# Patient Record
Sex: Male | Born: 1965
Health system: Southern US, Community
[De-identification: ages and names within clinical notes are randomized; demographics above are authoritative.]

## PROBLEM LIST (undated history)

## (undated) DIAGNOSIS — C189 Malignant neoplasm of colon, unspecified: Secondary | ICD-10-CM

## (undated) DIAGNOSIS — I10 Essential (primary) hypertension: Secondary | ICD-10-CM

## (undated) DIAGNOSIS — C801 Malignant (primary) neoplasm, unspecified: Secondary | ICD-10-CM

## (undated) DIAGNOSIS — F32A Depression, unspecified: Secondary | ICD-10-CM

## (undated) DIAGNOSIS — M109 Gout, unspecified: Secondary | ICD-10-CM

## (undated) DIAGNOSIS — F419 Anxiety disorder, unspecified: Secondary | ICD-10-CM

## (undated) DIAGNOSIS — T7840XA Allergy, unspecified, initial encounter: Secondary | ICD-10-CM

## (undated) DIAGNOSIS — G4730 Sleep apnea, unspecified: Secondary | ICD-10-CM

## (undated) DIAGNOSIS — E785 Hyperlipidemia, unspecified: Secondary | ICD-10-CM

## (undated) DIAGNOSIS — K219 Gastro-esophageal reflux disease without esophagitis: Secondary | ICD-10-CM

## (undated) HISTORY — DX: Allergy, unspecified, initial encounter: T78.40XA

## (undated) HISTORY — DX: Gastro-esophageal reflux disease without esophagitis: K21.9

## (undated) HISTORY — DX: Hyperlipidemia, unspecified: E78.5

## (undated) HISTORY — DX: Anxiety disorder, unspecified: F41.9

## (undated) HISTORY — DX: Gout, unspecified: M10.9

## (undated) HISTORY — DX: Depression, unspecified: F32.A

## (undated) HISTORY — PX: ANKLE SURGERY: SHX546

---

## 2003-11-19 ENCOUNTER — Emergency Department (HOSPITAL_COMMUNITY): Admission: EM | Admit: 2003-11-19 | Discharge: 2003-11-19 | Payer: Self-pay

## 2006-04-21 ENCOUNTER — Encounter: Admission: RE | Admit: 2006-04-21 | Discharge: 2006-04-21 | Payer: Self-pay | Admitting: Orthopedic Surgery

## 2006-08-26 ENCOUNTER — Ambulatory Visit (HOSPITAL_BASED_OUTPATIENT_CLINIC_OR_DEPARTMENT_OTHER): Admission: RE | Admit: 2006-08-26 | Discharge: 2006-08-26 | Payer: Self-pay | Admitting: Orthopedic Surgery

## 2010-08-07 NOTE — Op Note (Signed)
Derek [name], SANTUCCI NO.:  [id number]   MEDICAL RECORD NO.:  [id number]          PATIENT TYPE:  AMB   LOCATION:  DSC                          FACILITY:  [facility]   PHYSICIAN:  Leonides Grills, M.D.     DATE OF BIRTH:  31-Mar-1965   DATE OF PROCEDURE:  08/26/2006  DATE OF DISCHARGE:                               OPERATIVE REPORT   PREOPERATIVE DIAGNOSIS:  Complication with hardware, right ankle.   POSTOPERATIVE DIAGNOSIS:  Complication with hardware, right ankle.   OPERATION:  1. Hardware removal, deep right ankle.  2. Stress x-rays, ankle.   ANESTHESIA:  General.   SURGEON:  Leonides Grills, MD  ASSISTANT: Evlyn Kanner, PA-C   ESTIMATED BLOOD LOSS:  Minimal.   TOURNIQUET:  None.   COMPLICATIONS:  None.   DISPOSITION:  Stable to PR.   INDICATIONS:  This is a 45 year old male who sustained a complex ankle  fracture with a distal tib-fib syndesmotic rupture that required  fixation.  He now presented for hardware removal of his syndesmotic  fixation.  He was consented to the above procedure.  All risks including  infection, neurovascular injury, and the possibility of syndesmotic  ligament insufficiency and possible re-fixation were all explained.  Questions were encouraged and answered.   OPERATIVE REPORT:  The patient was brought to the Operating Room and  placed in the supine position.  After adequate general endotracheal tube  anesthesia was administered as well as Ancef 1 gram IV piggyback, the  right lower extremity was prepped and draped in a sterile manner.  No  tourniquet was used.  A longitudinal incision under C-arm guidance was  then made directly over the syndesmotic screw head over the previous  incision.  Dissection was carried down directly to bone.  Soft tissue  was elevated around the screw head, and with a large frag screwdriver  the screw was removed.  This was done without difficulty.  Rotational  stress x-rays were obtained of the right  ankle that showed no lateral  subluxation of the talus within the ankle mortise or diastasis of the  distal tib-fib syndesmosis.  The wound was copiously irrigated with  normal saline.  The skin was closed with a 4-0 nylon stitch.  A sterile  dressing was applied.  A hard-soled shoe was applied.  The patient was  stable to the PR.   POSTOP COURSE:  The patient will follow up on Monday of next week.  At  that time we will remove the stitches and will obtain a weightbearing AP  of the bilateral ankles.  He will then proceed with activities as  tolerated in a normal shoe.      Leonides Grills, M.D.  Electronically Signed     PB/MEDQ  D:  08/26/2006  T:  08/26/2006  Job:  [id number]

## 2011-01-10 LAB — BASIC METABOLIC PANEL
CO2: 25
GFR calc Af Amer: >60
Glucose, Bld: 89
Potassium: 4
Sodium: 137

## 2011-01-10 LAB — POCT HEMOGLOBIN-HEMACUE: Hemoglobin: 17.2 — ABNORMAL HIGH

## 2011-02-09 ENCOUNTER — Emergency Department (HOSPITAL_COMMUNITY): Payer: BC Managed Care – PPO

## 2011-02-09 ENCOUNTER — Encounter: Payer: Self-pay | Admitting: *Deleted

## 2011-02-09 ENCOUNTER — Emergency Department (HOSPITAL_COMMUNITY)
Admission: EM | Admit: 2011-02-09 | Discharge: 2011-02-09 | Disposition: A | Discharge status: Home / Self Care | Payer: BC Managed Care – PPO | Attending: Emergency Medicine | Admitting: Emergency Medicine

## 2011-02-09 DIAGNOSIS — M21372 Foot drop, left foot: Secondary | ICD-10-CM

## 2011-02-09 DIAGNOSIS — M79609 Pain in unspecified limb: Secondary | ICD-10-CM | POA: Insufficient documentation

## 2011-02-09 DIAGNOSIS — M216X9 Other acquired deformities of unspecified foot: Secondary | ICD-10-CM | POA: Insufficient documentation

## 2011-02-09 HISTORY — DX: Essential (primary) hypertension: I10

## 2011-02-09 NOTE — ED Provider Notes (Signed)
History     CSN: [id number] Arrival date & time: 02/09/2011  5:25 PM   First MD Initiated Contact with Patient 02/09/11 1814      Chief Complaint  Patient presents with  . Foot Pain    (Consider location/radiation/quality/duration/timing/severity/associated sxs/prior treatment) The history is provided by the patient.   the patient awoke this morning with inability to dorsiflex his left foot (foot drop).  He reports he was normal yesterday and awoke this morning like that.  His had no new back pain.  He is otherwise had no weakness of his left leg.  His had no numbness or tingling of his left leg.  He denies recent trauma.  Patient does have a history of remote back pain however no current active back pain.  He does report having several drinks last night which he reports is more than typical for him.  Nothing worsens the symptoms.  Nothing improves his symptoms.  Currently he is with 0/10 pain  Past Medical History  Diagnosis Date  . Hypertension     History reviewed. No pertinent past surgical history.  History reviewed. No pertinent family history.  History  Substance Use Topics  . Smoking status: Never Smoker   . Smokeless tobacco: Not on file  . Alcohol Use: Yes      Review of Systems  All other systems reviewed and are negative.    Allergies  Review of patient's allergies indicates no known allergies.  Home Medications   Current Outpatient Rx  Name Route Sig Dispense Refill  . ALPRAZOLAM 0.25 MG PO TABS Oral Take 0.25 mg by mouth 3 (three) times daily as needed. Anxiety      . ASPIRIN 81 MG PO TABS Oral Take 81 mg by mouth daily.      Marland Kitchen CITALOPRAM HYDROBROMIDE 40 MG PO TABS Oral Take 40 mg by mouth daily.      . OMEGA-3 FATTY ACIDS 1000 MG PO CAPS Oral Take 1 g by mouth daily.      Marland Kitchen HYDROCHLOROTHIAZIDE 25 MG PO TABS Oral Take 25 mg by mouth daily.      Marland Kitchen LISINOPRIL 20 MG PO TABS Oral Take 20 mg by mouth daily.      Carma Leaven M PLUS PO TABS Oral Take 1 tablet  by mouth daily.      . NEBIVOLOL HCL 10 MG PO TABS Oral Take 10 mg by mouth daily.      Marland Kitchen OMEPRAZOLE 20 MG PO CPDR Oral Take 20 mg by mouth daily.      Marland Kitchen POTASSIUM CHLORIDE 10 MEQ PO TBCR Oral Take 10 mEq by mouth daily.      Marland Kitchen SIMVASTATIN 40 MG PO TABS Oral Take 40 mg by mouth at bedtime.        BP 148/86  Pulse 88  Temp(Src) 98 F (36.7 C) (Oral)  Resp 20  SpO2 98%  Physical Exam  Constitutional: He is oriented to person, place, and time. He appears well-developed and well-nourished.  HENT:  Head: Normocephalic.  Eyes: EOM are normal.  Neck: Normal range of motion.  Pulmonary/Chest: Effort normal.  Musculoskeletal: Normal range of motion.       No thoracic or L-spine tenderness or paraspinal tenderness.  Right lower extremity with normal strength in his major muscle groups.  Left lower extremity was 5 out of 5 strength in all major muscle groups except he has the inability to dorsiflex his left foot.  He has 2+ patellar reflexes bilaterally.  Neurological:  He is alert and oriented to person, place, and time.  Psychiatric: He has a normal mood and affect.    ED Course  Procedures (including critical care time)  Labs Reviewed - No data to display Mr Lumbar Spine Wo Contrast  02/09/2011  *RADIOLOGY REPORT*  Clinical Data: Low back pain.  New foot drop  MRI LUMBAR SPINE WITHOUT CONTRAST  Technique:  Multiplanar and multiecho pulse sequences of the lumbar spine were obtained without intravenous contrast.  Comparison: None.  Findings: Normal alignment.  No fracture or mass lesion.  Conus medullaris is normal and terminates at L1.  L1-2:  Mild disc degeneration  L2-3:  Negative  L3-4:  Negative  L4-5:  Mild disc degeneration.  Small left foraminal disc protrusion without neural impingement or significant spinal stenosis.  Early facet degeneration.  L5-S1:  Mild disc bulging and mild facet degeneration without neural compression or stenosis.  IMPRESSION: Mild disc degeneration at L4-5.   Small left foraminal disc protrusion which is not causing any neural compression.  Mild disc and facet degeneration L5-S1 without stenosis.  Original Report Authenticated By: Camelia Phenes, M.D.   I personally reviewed the patient's MRI scan  1. Left foot drop       MDM  Left foot drop with likely peroneal nerve involvement.  The patient's history of remote back pain and MRI results found was obtained demonstrating no evidence of acute pathology.  Her neurosurgeon Dr. Jordan Likes is where the patient was coming and briefly evaluated the patient in the emergency department and agreed with MRI however he also agreed that this is likely peroneal nerve involvement.  Dr Jordan Likes requested that if everything is normal with the MRI scan To see him back in his office in one to 2 weeks for repeat evaluation        Lyanne Co, MD 02/09/11 [phone]

## 2011-02-09 NOTE — ED Notes (Signed)
Patient here with c/o left foot "drop foot".  He noticed it this morning. States that family has history of same.  No problems ambulating.  Patient c/o "slight numbess" to that left foot.

## 2011-03-20 ENCOUNTER — Other Ambulatory Visit: Payer: Self-pay | Admitting: *Deleted

## 2011-03-21 ENCOUNTER — Other Ambulatory Visit: Payer: Self-pay | Admitting: *Deleted

## 2011-03-22 ENCOUNTER — Other Ambulatory Visit: Payer: Self-pay | Admitting: *Deleted

## 2011-03-22 MED ORDER — LISINOPRIL 20 MG PO TABS: 20.0000 mg | ORAL_TABLET | Freq: Every day | ORAL | Status: DC

## 2011-03-22 MED ORDER — NEBIVOLOL HCL 10 MG PO TABS: 10.0000 mg | ORAL_TABLET | Freq: Every day | ORAL | Status: DC

## 2011-03-22 MED ORDER — POTASSIUM CHLORIDE 10 MEQ PO TBCR: 10.0000 meq | EXTENDED_RELEASE_TABLET | Freq: Every day | ORAL | Status: DC

## 2011-03-22 MED ORDER — SIMVASTATIN 40 MG PO TABS: 40.0000 mg | ORAL_TABLET | Freq: Every day | ORAL | Status: DC

## 2011-03-22 MED ORDER — HYDROCHLOROTHIAZIDE 25 MG PO TABS: 25.0000 mg | ORAL_TABLET | Freq: Every day | ORAL | Status: DC

## 2011-03-25 ENCOUNTER — Other Ambulatory Visit: Payer: Self-pay | Admitting: *Deleted

## 2011-03-25 MED ORDER — NEBIVOLOL HCL 10 MG PO TABS: 10.0000 mg | ORAL_TABLET | Freq: Every day | ORAL | Status: DC

## 2011-03-25 MED ORDER — LISINOPRIL 20 MG PO TABS: 20.0000 mg | ORAL_TABLET | Freq: Every day | ORAL | Status: DC

## 2011-03-25 MED ORDER — HYDROCHLOROTHIAZIDE 25 MG PO TABS: 25.0000 mg | ORAL_TABLET | Freq: Every day | ORAL | Status: DC

## 2011-03-25 MED ORDER — SIMVASTATIN 40 MG PO TABS: 40.0000 mg | ORAL_TABLET | Freq: Every day | ORAL | Status: DC

## 2011-03-25 NOTE — Telephone Encounter (Signed)
Fax Received. Refill Completed. Stacy Deshler Chowoe (R.M.A)   

## 2011-03-27 ENCOUNTER — Other Ambulatory Visit: Payer: Self-pay | Admitting: *Deleted

## 2011-03-27 MED ORDER — POTASSIUM CHLORIDE ER 10 MEQ PO TBCR: 10.0000 meq | EXTENDED_RELEASE_TABLET | Freq: Every day | ORAL | Status: DC

## 2011-03-27 NOTE — Telephone Encounter (Signed)
Fax Received. Refill Completed. Atif Chapple Chowoe (R.M.A)   

## 2011-04-25 ENCOUNTER — Ambulatory Visit: Payer: BC Managed Care – PPO | Admitting: Cardiovascular Disease

## 2011-07-18 ENCOUNTER — Encounter: Payer: Self-pay | Admitting: *Deleted

## 2011-09-24 ENCOUNTER — Other Ambulatory Visit: Payer: Self-pay | Admitting: *Deleted

## 2011-09-24 MED ORDER — POTASSIUM CHLORIDE ER 10 MEQ PO TBCR: 10.0000 meq | EXTENDED_RELEASE_TABLET | Freq: Every day | ORAL | Status: DC

## 2011-09-24 MED ORDER — SIMVASTATIN 40 MG PO TABS: 40.0000 mg | ORAL_TABLET | Freq: Every day | ORAL | Status: DC

## 2011-09-24 MED ORDER — LISINOPRIL-HYDROCHLOROTHIAZIDE 20-25 MG PO TABS: 1.0000 | ORAL_TABLET | Freq: Every day | ORAL | Status: DC

## 2011-09-24 MED ORDER — NEBIVOLOL HCL 10 MG PO TABS: 10.0000 mg | ORAL_TABLET | Freq: Every day | ORAL | Status: DC

## 2011-09-24 NOTE — Telephone Encounter (Signed)
Left a message on patients cell stating office is unable to make any more until patient comes in for an office visit. Pt has not seen Doctor Nahser since 2011.Pt needs appointment then refill can be made Fax Received. Refill Completed. Derek [name] (R.M.A). I am going to give patient 90 days supply due to him using a mail order pharmacy.

## 2011-09-24 NOTE — Telephone Encounter (Signed)
Pt needs appointment then refill can be made Fax Received. Refill Completed. Gretna Bergin Chowoe (R.M.A)   

## 2011-10-21 ENCOUNTER — Telehealth: Payer: Self-pay | Admitting: Cardiovascular Disease

## 2011-10-21 NOTE — Telephone Encounter (Signed)
New Problem:    Patient called in to confirm that we nebivolol (BYSTOLIC) 10 MG tablet, hydrochlorothiazide (HYDRODIURIL) 25 MG tablet, lisinopril-hydrochlorothiazide (PRINZIDE,ZESTORETIC) 20-25 MG per tablet and Klor-Con.  Please call back once the orders have been placed.

## 2011-10-22 ENCOUNTER — Other Ambulatory Visit: Payer: Self-pay | Admitting: *Deleted

## 2011-10-22 MED ORDER — POTASSIUM CHLORIDE ER 10 MEQ PO TBCR: 10.0000 meq | EXTENDED_RELEASE_TABLET | Freq: Every day | ORAL | Status: DC

## 2011-10-22 MED ORDER — HYDROCHLOROTHIAZIDE 25 MG PO TABS: 25.0000 mg | ORAL_TABLET | Freq: Every day | ORAL | Status: DC

## 2011-10-22 MED ORDER — SIMVASTATIN 40 MG PO TABS: 40.0000 mg | ORAL_TABLET | Freq: Every day | ORAL | Status: DC

## 2011-10-22 MED ORDER — NEBIVOLOL HCL 10 MG PO TABS: 10.0000 mg | ORAL_TABLET | Freq: Every day | ORAL | Status: DC

## 2011-10-22 MED ORDER — LISINOPRIL-HYDROCHLOROTHIAZIDE 20-25 MG PO TABS: 1.0000 | ORAL_TABLET | Freq: Every day | ORAL | Status: DC

## 2011-10-22 NOTE — Telephone Encounter (Signed)
Pt needs appointment then refill can be made Fax Received. Refill Completed. Derek [name] (R.M.A)   

## 2011-10-22 NOTE — Telephone Encounter (Signed)
rx sent to [facility] and one sent to local pharmacy. Pt notified to keep appt with nahser on 8/19.

## 2011-11-11 ENCOUNTER — Ambulatory Visit: Payer: BC Managed Care – PPO | Admitting: Cardiovascular Disease

## 2011-12-03 ENCOUNTER — Encounter: Payer: Self-pay | Admitting: Cardiovascular Disease

## 2011-12-05 ENCOUNTER — Encounter: Payer: Self-pay | Admitting: Cardiovascular Disease

## 2011-12-13 ENCOUNTER — Ambulatory Visit (INDEPENDENT_AMBULATORY_CARE_PROVIDER_SITE_OTHER): Payer: 59 | Admitting: Cardiovascular Disease

## 2011-12-13 ENCOUNTER — Encounter: Payer: Self-pay | Admitting: Cardiovascular Disease

## 2011-12-13 VITALS — BP 146/91 | HR 72 | Ht 75.0 in | Wt 255.4 lb

## 2011-12-13 DIAGNOSIS — E785 Hyperlipidemia, unspecified: Secondary | ICD-10-CM

## 2011-12-13 DIAGNOSIS — I10 Essential (primary) hypertension: Secondary | ICD-10-CM

## 2011-12-13 MED ORDER — NEBIVOLOL HCL 20 MG PO TABS: 20.0000 mg | ORAL_TABLET | Freq: Every day | ORAL | Status: DC

## 2011-12-13 NOTE — Progress Notes (Signed)
Trellis Moment Date of Birth  January 03, 1966       [facility]    [place] [address]  [address], [address] [place], [place]  [address]   [place], [place]  [address] [phone]     [phone]   Fax  [phone]    Fax [phone]  Problem List: 1. HTN 2. Hyperlipidemia  History of Present Illness:  Dryden is a 46 yo with hx of HTN and hyperlipidemia.  He   Current Outpatient Prescriptions on File Prior to Visit  Medication Sig Dispense Refill  . ALPRAZolam (XANAX) 0.25 MG tablet Take 0.25 mg by mouth 3 (three) times daily as needed. Anxiety        . aspirin 81 MG tablet Take 81 mg by mouth daily.        . citalopram (CELEXA) 40 MG tablet Take 40 mg by mouth daily.        . fish oil-omega-3 fatty acids 1000 MG capsule Take 1 g by mouth daily.        . hydrochlorothiazide (HYDRODIURIL) 25 MG tablet Take 1 tablet (25 mg total) by mouth daily.  90 tablet  0  . lisinopril-hydrochlorothiazide (PRINZIDE,ZESTORETIC) 20-25 MG per tablet Take 1 tablet by mouth daily.  90 tablet  0  . Multiple Vitamins-Minerals (MULTIVITAMINS THER. W/MINERALS) TABS Take 1 tablet by mouth daily.        . nebivolol (BYSTOLIC) 10 MG tablet Take 1 tablet (10 mg total) by mouth daily.  90 tablet  0  . omeprazole (PRILOSEC) 20 MG capsule Take 20 mg by mouth daily.        . potassium chloride (K-DUR) 10 MEQ tablet Take 1 tablet (10 mEq total) by mouth daily.  90 tablet  0  . simvastatin (ZOCOR) 40 MG tablet Take 1 tablet (40 mg total) by mouth at bedtime.  90 tablet  0    No Known Allergies  Past Medical History  Diagnosis Date  . Hypertension   . HLD (hyperlipidemia)   . GERD (gastroesophageal reflux disease)   . Anxiety     Past Surgical History  Procedure Date  . Ankle surgery     right    History  Smoking status  . Never Smoker   Smokeless tobacco  . Not on file    History  Alcohol Use  . Yes    Family History  Problem Relation Age of Onset  .  Coronary artery disease    . Hypertension      Reviw of Systems:  Reviewed in the HPI.  All other systems are negative.  Physical Exam: Blood pressure 146/91, pulse 72, height 6\' 3"  (1.905 m), weight 255 lb 6.4 oz (115.849 kg). General: Well developed, well nourished, in no acute distress.  Head: Normocephalic, atraumatic, sclera non-icteric, mucus membranes are moist,   Neck: Supple. Carotids are 2 + without bruits. No JVD  Lungs: Clear bilaterally to auscultation.  Heart: regular rate.  normal  S1 S2. No murmurs, gallops or rubs.  Abdomen: Soft, non-tender, non-distended with normal bowel sounds. No hepatomegaly. No rebound/guarding. No masses.  Msk:  Strength and tone are normal  Extremities: No clubbing or cyanosis. No edema.  Distal pedal pulses are 2+ and equal bilaterally.  Neuro: Alert and oriented X 3. Moves all extremities spontaneously.  Psych:  Responds to questions appropriately with a normal affect.  ECG: 12/13/2011-normal sinus rhythm at 70 beats a minute. The EKG is normal.  Assessment /  Plan:

## 2011-12-13 NOTE — Patient Instructions (Addendum)
Your physician wants you to follow-up in: 1 YEAR  You will receive a reminder letter in the mail two months in advance. If you don't receive a letter, please call our office to schedule the follow-up appointment.  Your physician has recommended you make the following change in your medication:  INCREASE BYSTOLIC TO 20 MG DAILY- F/U WITH YOUR FAMILY PHYSICIAN  REDUCE HIGH SODIUM FOODS LIKE CANNED SOUP, GRAVY, SAUCES, READY PREPARED FOODS LIKE FROZEN FOODS; LEAN CUISINE, LASAGNA. BACON, SAUSAGE, LUNCH MEAT, FAST FOODS.Marland Kitchen    DASH Diet The DASH diet stands for "Dietary Approaches to Stop Hypertension." It is a healthy eating plan that has been shown to reduce high blood pressure (hypertension) in as little as 14 days, while also possibly providing other significant health benefits. These other health benefits include reducing the risk of breast cancer after menopause and reducing the risk of type 2 diabetes, heart disease, colon cancer, and stroke. Health benefits also include weight loss and slowing kidney failure in patients with chronic kidney disease.  DIET GUIDELINES  Limit salt (sodium). Your diet should contain less than 1500 mg of sodium daily.   Limit refined or processed carbohydrates. Your diet should include mostly whole grains. Desserts and added sugars should be used sparingly.   Include small amounts of heart-healthy fats. These types of fats include nuts, oils, and tub margarine. Limit saturated and trans fats. These fats have been shown to be harmful in the body.  CHOOSING FOODS  The following food groups are based on a 2000 calorie diet. See your Registered Dietitian for individual calorie needs. Grains and Grain Products (6 to 8 servings daily)  Eat More Often: Whole-wheat bread, brown rice, whole-grain or wheat pasta, quinoa, popcorn without added fat or salt (air popped).   Eat Less Often: White bread, white pasta, white rice, cornbread.  Vegetables (4 to 5 servings  daily)  Eat More Often: Fresh, frozen, and canned vegetables. Vegetables may be raw, steamed, roasted, or grilled with a minimal amount of fat.   Eat Less Often/Avoid: Creamed or fried vegetables. Vegetables in a cheese sauce.  Fruit (4 to 5 servings daily)  Eat More Often: All fresh, canned (in natural juice), or frozen fruits. Dried fruits without added sugar. One hundred percent fruit juice ( cup [237 mL] daily).   Eat Less Often: Dried fruits with added sugar. Canned fruit in light or heavy syrup.  [employer] [place], Fish, and Poultry (2 servings or less daily. One serving is 3 to 4 oz [85-114 g]).  Eat More Often: Ninety percent or leaner ground beef, tenderloin, sirloin. Round cuts of beef, chicken breast, [place] breast. All fish. Grill, bake, or broil your meat. Nothing should be fried.   Eat Less Often/Avoid: Fatty cuts of meat, [place], or chicken leg, thigh, or wing. Fried cuts of meat or fish.  Dairy (2 to 3 servings)  Eat More Often: Low-fat or fat-free milk, low-fat plain or light yogurt, reduced-fat or part-skim cheese.   Eat Less Often/Avoid: Milk (whole, 2%, skim, or chocolate).Whole milk yogurt. Full-fat cheeses.  Nuts, Seeds, and Legumes (4 to 5 servings per week)  Eat More Often: All without added salt.   Eat Less Often/Avoid: Salted nuts and seeds, canned beans with added salt.  Fats and Sweets (limited)  Eat More Often: Vegetable oils, tub margarines without trans fats, sugar-free gelatin. Mayonnaise and salad dressings.   Eat Less Often/Avoid: Coconut oils, palm oils, butter, stick margarine, cream, half and half, cookies, candy, pie.  FOR  MORE INFORMATION The Dash Diet Eating Plan: www.dashdiet.org Document Released: 02/28/2011 Document Reviewed: 02/18/2011 [facility] Patient Information 2012 [place], [place].

## 2011-12-13 NOTE — Assessment & Plan Note (Signed)
Pounds blood pressure remains mildly elevated. He is not able to exercise as much as possible because of his new job. He still eats occasional salty food.  We will increase his Bystolic to 20 mg a day. I'll see him in one year. He is to call me if he has any additional problems.

## 2011-12-19 ENCOUNTER — Encounter: Payer: Self-pay | Admitting: Cardiovascular Disease

## 2012-02-05 ENCOUNTER — Other Ambulatory Visit: Payer: Self-pay | Admitting: *Deleted

## 2012-02-05 MED ORDER — POTASSIUM CHLORIDE ER 10 MEQ PO TBCR: 10.0000 meq | EXTENDED_RELEASE_TABLET | Freq: Every day | ORAL | Status: DC

## 2012-02-05 MED ORDER — LISINOPRIL-HYDROCHLOROTHIAZIDE 20-25 MG PO TABS: 1.0000 | ORAL_TABLET | Freq: Every day | ORAL | Status: DC

## 2012-02-05 MED ORDER — NEBIVOLOL HCL 20 MG PO TABS: 20.0000 mg | ORAL_TABLET | Freq: Every day | ORAL | Status: DC

## 2012-02-05 MED ORDER — HYDROCHLOROTHIAZIDE 25 MG PO TABS: 25.0000 mg | ORAL_TABLET | Freq: Every day | ORAL | Status: DC

## 2012-02-05 MED ORDER — SIMVASTATIN 40 MG PO TABS: 40.0000 mg | ORAL_TABLET | Freq: Every day | ORAL | Status: DC

## 2012-02-05 NOTE — Telephone Encounter (Signed)
Fax Received. Refill Completed. Travon Crochet Chowoe (R.M.A)   

## 2012-02-05 NOTE — Telephone Encounter (Signed)
Fax Received. Refill Completed. Tyreke Kaeser Chowoe (R.M.A)   

## 2012-02-06 ENCOUNTER — Other Ambulatory Visit: Payer: Self-pay | Admitting: *Deleted

## 2012-02-06 NOTE — Telephone Encounter (Signed)
Opened in Error.

## 2012-02-10 ENCOUNTER — Other Ambulatory Visit: Payer: Self-pay | Admitting: *Deleted

## 2012-02-10 NOTE — Telephone Encounter (Signed)
Opened in Error.

## 2012-02-24 ENCOUNTER — Telehealth: Payer: Self-pay | Admitting: Cardiovascular Disease

## 2012-02-24 MED ORDER — POTASSIUM CHLORIDE ER 10 MEQ PO TBCR: 10.0000 meq | EXTENDED_RELEASE_TABLET | Freq: Every day | ORAL | Status: DC

## 2012-02-24 NOTE — Telephone Encounter (Signed)
Pt needs potassium called into [facility] for 30 day supply will run out before mail order gets to him

## 2012-02-24 NOTE — Telephone Encounter (Signed)
Fax Received. Refill Completed. Vu Liebman Chowoe (R.M.A)   

## 2012-10-30 ENCOUNTER — Telehealth: Payer: Self-pay | Admitting: *Deleted

## 2012-10-30 NOTE — Telephone Encounter (Signed)
Received PA request from [facility] [employer] for bystolic. Pt does not have an app and I asked him to call back to see if we need to obtain the PA or his pcp Dr Eloise Harman. I asked him to call back and update me/ I am holding the paperwork till further word.

## 2012-10-30 NOTE — Telephone Encounter (Signed)
PT WILL HAVE HIS PCP REFILL HIS BYSTOLIC, DECLINES APPOINTMENT AT THIS TIME.

## 2012-11-17 ENCOUNTER — Telehealth: Payer: Self-pay | Admitting: Cardiovascular Disease

## 2012-11-17 MED ORDER — CARVEDILOL 25 MG PO TABS: 25.0000 mg | ORAL_TABLET | Freq: Two times a day (BID) | ORAL | Status: DC

## 2012-11-17 NOTE — Telephone Encounter (Signed)
We can try carvedilol 25 bid to see if that keeps his BP controlled since his insurance company has refused bystolic,

## 2012-11-17 NOTE — Telephone Encounter (Signed)
New Prob  Pt has a question regarding one of his medication Bystolic,  She previous TE that Jodette did with pt.

## 2012-11-17 NOTE — Telephone Encounter (Signed)
Patient states that the insurance company denied the Bystolic prior authorization submitted by his PCP.  States he only has a couple of Bystolic doses left at this time. Most recent BP 140/85 range. States he needs advisement on whether he should come in for followup with Dr. Elease Hashimoto and appeal the Bystolic denial or is there another medication that he can take instead.

## 2012-11-17 NOTE — Telephone Encounter (Signed)
Spoke to patient. He agreed with treatment plan. Asked for Carvedilol to be called in to the [facility] so that he can try it for one month, then if he feels it is working for him, he will call us back to send Rx into mail order pharmacy, [facility]. Reviewed medication, dosage, side effects and usage information. Completed Rx to pharmacy.

## 2014-04-07 ENCOUNTER — Emergency Department (HOSPITAL_COMMUNITY)
Admission: EM | Admit: 2014-04-07 | Discharge: 2014-04-07 | Disposition: A | Discharge status: Home / Self Care | Payer: 59 | Attending: Emergency Medicine | Admitting: Emergency Medicine

## 2014-04-07 ENCOUNTER — Encounter (HOSPITAL_COMMUNITY): Payer: Self-pay | Admitting: Emergency Medicine

## 2014-04-07 ENCOUNTER — Emergency Department (HOSPITAL_COMMUNITY): Payer: 59

## 2014-04-07 DIAGNOSIS — Z7982 Long term (current) use of aspirin: Secondary | ICD-10-CM | POA: Insufficient documentation

## 2014-04-07 DIAGNOSIS — K219 Gastro-esophageal reflux disease without esophagitis: Secondary | ICD-10-CM | POA: Diagnosis not present

## 2014-04-07 DIAGNOSIS — Z79899 Other long term (current) drug therapy: Secondary | ICD-10-CM | POA: Insufficient documentation

## 2014-04-07 DIAGNOSIS — E785 Hyperlipidemia, unspecified: Secondary | ICD-10-CM | POA: Insufficient documentation

## 2014-04-07 DIAGNOSIS — R0789 Other chest pain: Secondary | ICD-10-CM | POA: Insufficient documentation

## 2014-04-07 DIAGNOSIS — F419 Anxiety disorder, unspecified: Secondary | ICD-10-CM | POA: Diagnosis not present

## 2014-04-07 DIAGNOSIS — I10 Essential (primary) hypertension: Secondary | ICD-10-CM | POA: Insufficient documentation

## 2014-04-07 DIAGNOSIS — R079 Chest pain, unspecified: Secondary | ICD-10-CM

## 2014-04-07 LAB — CBC WITH DIFFERENTIAL/PLATELET
Basophils Absolute: 0 10*3/uL (ref 0.0–0.1)
Basophils Relative: 0 % (ref 0–1)
EOS ABS: 0.2 10*3/uL (ref 0.0–0.7)
EOS PCT: 2 % (ref 0–5)
HCT: 41.2 % (ref 39.0–52.0)
Hemoglobin: 14.5 g/dL (ref 13.0–17.0)
Lymphocytes Relative: 38 % (ref 12–46)
Lymphs Abs: 3 10*3/uL (ref 0.7–4.0)
MCH: 30.7 pg (ref 26.0–34.0)
MCHC: 35.2 g/dL (ref 30.0–36.0)
MCV: 87.1 fL (ref 78.0–100.0)
MONO ABS: 0.6 10*3/uL (ref 0.1–1.0)
MONOS PCT: 8 % (ref 3–12)
NEUTROS ABS: 4 10*3/uL (ref 1.7–7.7)
Neutrophils Relative %: 52 % (ref 43–77)
PLATELETS: 185 10*3/uL (ref 150–400)
RBC: 4.73 MIL/uL (ref 4.22–5.81)
RDW: 12.5 % (ref 11.5–15.5)
WBC: 7.8 10*3/uL (ref 4.0–10.5)

## 2014-04-07 LAB — I-STAT TROPONIN, ED: TROPONIN I, POC: 0 ng/mL (ref 0.00–0.08)

## 2014-04-07 LAB — BASIC METABOLIC PANEL
Anion gap: 5 (ref 5–15)
BUN: 14 mg/dL (ref 6–23)
CO2: 31 mmol/L (ref 19–32)
Calcium: 9.9 mg/dL (ref 8.4–10.5)
Chloride: 101 mEq/L (ref 96–112)
Creatinine, Ser: 1.08 mg/dL (ref 0.50–1.35)
GFR, EST NON AFRICAN AMERICAN: 80 mL/min — AB (ref >90)
Glucose, Bld: 131 mg/dL — ABNORMAL HIGH (ref 70–99)
POTASSIUM: 3.7 mmol/L (ref 3.5–5.1)
Sodium: 137 mmol/L (ref 135–145)

## 2014-04-07 NOTE — ED Provider Notes (Signed)
CSN: [id number]     Arrival date & time 04/07/14  1148 History   First MD Initiated Contact with Patient 04/07/14 1440     Chief Complaint  Patient presents with  . Chest Pain     (Consider location/radiation/quality/duration/timing/severity/associated sxs/prior Treatment) HPI Comments: Patient with past medical history remarkable for hypertension and hyperlipidemia, and anxiety, presents to the emergency department with chief complaint of chest pain. States the pain started this morning and lasted for only a couple of seconds. He states that the pain was located in the center of his chest. He states that after he began thinking about the pain, he began to have a panic attack, became sweaty. He denies any shortness of breath, radiating symptoms, or exertional symptoms. He states the pain only lasted a few seconds, and then receded completely, but his anxiety persisted. Patient states that he took a 0.25 mg dose of Xanax with good relief. He states that he was still uncertain about the pain, so he came to the emergency department for further evaluation. He denies any cardiac history. Denies any history of DVT or PE.    The history is provided by the patient. No language interpreter was used.    Past Medical History  Diagnosis Date  . Hypertension   . HLD (hyperlipidemia)   . GERD (gastroesophageal reflux disease)   . Anxiety    Past Surgical History  Procedure Laterality Date  . Ankle surgery      right   Family History  Problem Relation Age of Onset  . Coronary artery disease    . Hypertension     History  Substance Use Topics  . Smoking status: Never Smoker   . Smokeless tobacco: Not on file  . Alcohol Use: Yes    Review of Systems  Constitutional: Negative for fever and chills.  Respiratory: Negative for shortness of breath.   Cardiovascular: Positive for chest pain.  Gastrointestinal: Negative for nausea, vomiting, diarrhea and constipation.  Genitourinary: Negative for  dysuria.  Psychiatric/Behavioral: The patient is nervous/anxious.   All other systems reviewed and are negative.     Allergies  Review of patient's allergies indicates no known allergies.  Home Medications   Prior to Admission medications   Medication Sig Start Date End Date Taking? Authorizing Provider  ALPRAZolam (XANAX) 0.25 MG tablet Take 0.25 mg by mouth 3 (three) times daily as needed. Anxiety      Historical Provider, MD  aspirin 81 MG tablet Take 81 mg by mouth daily.      Historical Provider, MD  carvedilol (COREG) 25 MG tablet Take 1 tablet (25 mg total) by mouth 2 (two) times daily with a meal. 11/17/12   Thayer Headings, MD  citalopram (CELEXA) 40 MG tablet Take 40 mg by mouth daily.      Historical Provider, MD  fish oil-omega-3 fatty acids 1000 MG capsule Take 1 g by mouth daily.      Historical Provider, MD  hydrochlorothiazide (HYDRODIURIL) 25 MG tablet Take 1 tablet (25 mg total) by mouth daily. 02/05/12   Thayer Headings, MD  lisinopril-hydrochlorothiazide (PRINZIDE,ZESTORETIC) 20-25 MG per tablet Take 1 tablet by mouth daily. 02/05/12   Thayer Headings, MD  Multiple Vitamins-Minerals (MULTIVITAMINS THER. W/MINERALS) TABS Take 1 tablet by mouth daily.      Historical Provider, MD  Nebivolol HCl 20 MG TABS Take 1 tablet (20 mg total) by mouth daily. 02/05/12   Thayer Headings, MD  omeprazole (PRILOSEC) 20 MG capsule Take  20 mg by mouth daily.      Historical Provider, MD  potassium chloride (K-DUR) 10 MEQ tablet Take 1 tablet (10 mEq total) by mouth daily. 02/24/12   Thayer Headings, MD  simvastatin (ZOCOR) 40 MG tablet Take 1 tablet (40 mg total) by mouth at bedtime. 02/05/12   Thayer Headings, MD   BP 136/80 mmHg  Pulse 78  Temp(Src) 98.1 F (36.7 C) (Oral)  Resp 11  SpO2 98% Physical Exam  Constitutional: He is oriented to person, place, and time. He appears well-developed and well-nourished.  HENT:  Head: Normocephalic and atraumatic.  Eyes: Conjunctivae and  EOM are normal. Pupils are equal, round, and reactive to light. Right eye exhibits no discharge. Left eye exhibits no discharge. No scleral icterus.  Neck: Normal range of motion. Neck supple. No JVD present.  Cardiovascular: Normal rate, regular rhythm and normal heart sounds.  Exam reveals no gallop and no friction rub.   No murmur heard. Pulmonary/Chest: Effort normal and breath sounds normal. No respiratory distress. He has no wheezes. He has no rales. He exhibits no tenderness.  Abdominal: Soft. He exhibits no distension and no mass. There is no tenderness. There is no rebound and no guarding.  Musculoskeletal: Normal range of motion. He exhibits no edema or tenderness.  Neurological: He is alert and oriented to person, place, and time.  Skin: Skin is warm and dry.  Psychiatric: He has a normal mood and affect. His behavior is normal. Judgment and thought content normal.  Nursing note and vitals reviewed.   ED Course  Procedures (including critical care time) Results for orders placed or performed during the hospital encounter of 20/94/70  Basic metabolic panel  Result Value Ref Range   Sodium 137 135 - 145 mmol/L   Potassium 3.7 3.5 - 5.1 mmol/L   Chloride 101 96 - 112 mEq/L   CO2 31 19 - 32 mmol/L   Glucose, Bld 131 (H) 70 - 99 mg/dL   BUN 14 6 - 23 mg/dL   Creatinine, Ser 1.08 0.50 - 1.35 mg/dL   Calcium 9.9 8.4 - 10.5 mg/dL   GFR calc non Af Amer 80 (L) >90 mL/min   GFR calc Af Amer >90 >90 mL/min   Anion gap 5 5 - 15  CBC with Differential  Result Value Ref Range   WBC 7.8 4.0 - 10.5 K/uL   RBC 4.73 4.22 - 5.81 MIL/uL   Hemoglobin 14.5 13.0 - 17.0 g/dL   HCT 41.2 39.0 - 52.0 %   MCV 87.1 78.0 - 100.0 fL   MCH 30.7 26.0 - 34.0 pg   MCHC 35.2 30.0 - 36.0 g/dL   RDW 12.5 11.5 - 15.5 %   Platelets 185 150 - 400 K/uL   Neutrophils Relative % 52 43 - 77 %   Neutro Abs 4.0 1.7 - 7.7 K/uL   Lymphocytes Relative 38 12 - 46 %   Lymphs Abs 3.0 0.7 - 4.0 K/uL   Monocytes  Relative 8 3 - 12 %   Monocytes Absolute 0.6 0.1 - 1.0 K/uL   Eosinophils Relative 2 0 - 5 %   Eosinophils Absolute 0.2 0.0 - 0.7 K/uL   Basophils Relative 0 0 - 1 %   Basophils Absolute 0.0 0.0 - 0.1 K/uL  I-stat troponin, ED (not at [facility])  Result Value Ref Range   Troponin i, poc 0.00 0.00 - 0.08 ng/mL   Comment 3  Dg Chest 2 View  04/07/2014   CLINICAL DATA:  Acute left and central chest pain. Initial encounter.  EXAM: CHEST  2 VIEW  COMPARISON:  None.  FINDINGS: The cardiomediastinal silhouette is unremarkable.  There is no evidence of focal airspace disease, pulmonary edema, suspicious pulmonary nodule/mass, pleural effusion, or pneumothorax. No acute bony abnormalities are identified.  IMPRESSION: No active cardiopulmonary disease.   Electronically Signed   By: Hassan Rowan M.D.   On: 04/07/2014 13:59     Imaging Review Dg Chest 2 View  04/07/2014   CLINICAL DATA:  Acute left and central chest pain. Initial encounter.  EXAM: CHEST  2 VIEW  COMPARISON:  None.  FINDINGS: The cardiomediastinal silhouette is unremarkable.  There is no evidence of focal airspace disease, pulmonary edema, suspicious pulmonary nodule/mass, pleural effusion, or pneumothorax. No acute bony abnormalities are identified.  IMPRESSION: No active cardiopulmonary disease.   Electronically Signed   By: Hassan Rowan M.D.   On: 04/07/2014 13:59     EKG Interpretation   Date/Time:  Thursday April 07 2014 11:55:58 EST Ventricular Rate:  88 PR Interval:  136 QRS Duration: 94 QT Interval:  394 QTC Calculation: 476 R Axis:   49 Text Interpretation:  Normal sinus rhythm Normal ECG Confirmed by  ZACKOWSKI  MD, SCOTT ([id number]) on 04/07/2014 3:48:45 PM      MDM   Final diagnoses:  Chest pain, unspecified chest pain type  Anxiety    Patient with a brief episode of chest pain. Symptoms have resolved completely. Patient uncertain whether symptoms were ACS versus anxiety. At this time, I'm inclined to believe that  the symptoms are related to anxiety. Patient has a heart score of 2. He is PERC negative.  He has a negative troponin, and a normal EKG. He is feeling well. Plan for discharge to home with primary care and cardiology follow-up. Patient understands and agrees with the plan. He is stable and ready for discharge.    Montine Circle, PA-C 04/07/14 1555  Fredia Sorrow, MD 04/07/14 [phone]

## 2014-04-07 NOTE — Discharge Instructions (Signed)

## 2014-04-07 NOTE — ED Notes (Signed)
Pt c/o generalized CP starting today; pt sts hx of anxiety and unsure if could be same

## 2016-04-18 DIAGNOSIS — E291 Testicular hypofunction: Secondary | ICD-10-CM | POA: Diagnosis not present

## 2016-05-28 DIAGNOSIS — E298 Other testicular dysfunction: Secondary | ICD-10-CM | POA: Diagnosis not present

## 2016-06-14 DIAGNOSIS — Z0000 Encounter for general adult medical examination without abnormal findings: Secondary | ICD-10-CM | POA: Diagnosis not present

## 2016-06-14 DIAGNOSIS — R7301 Impaired fasting glucose: Secondary | ICD-10-CM | POA: Diagnosis not present

## 2016-06-14 DIAGNOSIS — E784 Other hyperlipidemia: Secondary | ICD-10-CM | POA: Diagnosis not present

## 2016-06-14 DIAGNOSIS — Z125 Encounter for screening for malignant neoplasm of prostate: Secondary | ICD-10-CM | POA: Diagnosis not present

## 2016-06-18 DIAGNOSIS — Z0000 Encounter for general adult medical examination without abnormal findings: Secondary | ICD-10-CM | POA: Diagnosis not present

## 2016-06-18 DIAGNOSIS — R740 Nonspecific elevation of levels of transaminase and lactic acid dehydrogenase [LDH]: Secondary | ICD-10-CM | POA: Diagnosis not present

## 2016-06-18 DIAGNOSIS — Z1389 Encounter for screening for other disorder: Secondary | ICD-10-CM | POA: Diagnosis not present

## 2016-06-18 DIAGNOSIS — R7301 Impaired fasting glucose: Secondary | ICD-10-CM | POA: Diagnosis not present

## 2016-07-04 DIAGNOSIS — E298 Other testicular dysfunction: Secondary | ICD-10-CM | POA: Diagnosis not present

## 2016-07-25 DIAGNOSIS — E298 Other testicular dysfunction: Secondary | ICD-10-CM | POA: Diagnosis not present

## 2016-08-01 DIAGNOSIS — Z85828 Personal history of other malignant neoplasm of skin: Secondary | ICD-10-CM | POA: Diagnosis not present

## 2016-08-01 DIAGNOSIS — L821 Other seborrheic keratosis: Secondary | ICD-10-CM | POA: Diagnosis not present

## 2016-08-01 DIAGNOSIS — L820 Inflamed seborrheic keratosis: Secondary | ICD-10-CM | POA: Diagnosis not present

## 2016-08-01 DIAGNOSIS — L570 Actinic keratosis: Secondary | ICD-10-CM | POA: Diagnosis not present

## 2016-08-14 DIAGNOSIS — E298 Other testicular dysfunction: Secondary | ICD-10-CM | POA: Diagnosis not present

## 2016-09-19 DIAGNOSIS — E291 Testicular hypofunction: Secondary | ICD-10-CM | POA: Diagnosis not present

## 2016-10-08 DIAGNOSIS — E298 Other testicular dysfunction: Secondary | ICD-10-CM | POA: Diagnosis not present

## 2016-10-31 DIAGNOSIS — E298 Other testicular dysfunction: Secondary | ICD-10-CM | POA: Diagnosis not present

## 2016-12-03 DIAGNOSIS — E298 Other testicular dysfunction: Secondary | ICD-10-CM | POA: Diagnosis not present

## 2017-01-02 DIAGNOSIS — E298 Other testicular dysfunction: Secondary | ICD-10-CM | POA: Diagnosis not present

## 2017-01-28 DIAGNOSIS — E291 Testicular hypofunction: Secondary | ICD-10-CM | POA: Diagnosis not present

## 2017-02-18 DIAGNOSIS — E298 Other testicular dysfunction: Secondary | ICD-10-CM | POA: Diagnosis not present

## 2017-03-12 DIAGNOSIS — Z23 Encounter for immunization: Secondary | ICD-10-CM | POA: Diagnosis not present

## 2017-03-12 DIAGNOSIS — E291 Testicular hypofunction: Secondary | ICD-10-CM | POA: Diagnosis not present

## 2017-03-28 DIAGNOSIS — L821 Other seborrheic keratosis: Secondary | ICD-10-CM | POA: Diagnosis not present

## 2017-03-28 DIAGNOSIS — L570 Actinic keratosis: Secondary | ICD-10-CM | POA: Diagnosis not present

## 2017-03-28 DIAGNOSIS — Z85828 Personal history of other malignant neoplasm of skin: Secondary | ICD-10-CM | POA: Diagnosis not present

## 2017-04-03 DIAGNOSIS — E298 Other testicular dysfunction: Secondary | ICD-10-CM | POA: Diagnosis not present

## 2017-04-29 DIAGNOSIS — E298 Other testicular dysfunction: Secondary | ICD-10-CM | POA: Diagnosis not present

## 2017-05-20 DIAGNOSIS — E298 Other testicular dysfunction: Secondary | ICD-10-CM | POA: Diagnosis not present

## 2017-05-23 DIAGNOSIS — C189 Malignant neoplasm of colon, unspecified: Secondary | ICD-10-CM

## 2017-05-23 HISTORY — DX: Malignant neoplasm of colon, unspecified: C18.9

## 2017-05-23 HISTORY — PX: COLONOSCOPY: SHX174

## 2017-06-09 DIAGNOSIS — Z1211 Encounter for screening for malignant neoplasm of colon: Secondary | ICD-10-CM | POA: Diagnosis not present

## 2017-06-09 DIAGNOSIS — C187 Malignant neoplasm of sigmoid colon: Secondary | ICD-10-CM | POA: Diagnosis not present

## 2017-06-09 DIAGNOSIS — Z8371 Family history of colonic polyps: Secondary | ICD-10-CM | POA: Diagnosis not present

## 2017-06-09 DIAGNOSIS — D126 Benign neoplasm of colon, unspecified: Secondary | ICD-10-CM | POA: Diagnosis not present

## 2017-06-17 DIAGNOSIS — C187 Malignant neoplasm of sigmoid colon: Secondary | ICD-10-CM | POA: Diagnosis not present

## 2017-06-20 DIAGNOSIS — R7301 Impaired fasting glucose: Secondary | ICD-10-CM | POA: Diagnosis not present

## 2017-06-20 DIAGNOSIS — R82998 Other abnormal findings in urine: Secondary | ICD-10-CM | POA: Diagnosis not present

## 2017-06-20 DIAGNOSIS — I10 Essential (primary) hypertension: Secondary | ICD-10-CM | POA: Diagnosis not present

## 2017-06-24 DIAGNOSIS — K633 Ulcer of intestine: Secondary | ICD-10-CM | POA: Diagnosis not present

## 2017-06-24 DIAGNOSIS — C187 Malignant neoplasm of sigmoid colon: Secondary | ICD-10-CM | POA: Diagnosis not present

## 2017-06-24 DIAGNOSIS — K649 Unspecified hemorrhoids: Secondary | ICD-10-CM | POA: Diagnosis not present

## 2017-06-26 ENCOUNTER — Other Ambulatory Visit: Payer: Self-pay | Admitting: Gastroenterology

## 2017-06-26 DIAGNOSIS — C187 Malignant neoplasm of sigmoid colon: Secondary | ICD-10-CM

## 2017-06-30 ENCOUNTER — Ambulatory Visit (HOSPITAL_COMMUNITY)
Admission: RE | Admit: 2017-06-30 | Discharge: 2017-06-30 | Disposition: A | Discharge status: Home / Self Care | Payer: 59 | Source: Ambulatory Visit | Attending: Gastroenterology | Admitting: Gastroenterology

## 2017-06-30 ENCOUNTER — Encounter (HOSPITAL_COMMUNITY): Payer: Self-pay

## 2017-06-30 DIAGNOSIS — I700 Atherosclerosis of aorta: Secondary | ICD-10-CM | POA: Insufficient documentation

## 2017-06-30 DIAGNOSIS — K449 Diaphragmatic hernia without obstruction or gangrene: Secondary | ICD-10-CM | POA: Insufficient documentation

## 2017-06-30 DIAGNOSIS — C187 Malignant neoplasm of sigmoid colon: Secondary | ICD-10-CM | POA: Insufficient documentation

## 2017-06-30 MED ORDER — IOHEXOL 300 MG/ML  SOLN
100.0000 mL | Freq: Once | INTRAMUSCULAR | Status: AC | PRN
  Administered 2017-06-30: 100 mL via INTRAVENOUS

## 2017-07-03 ENCOUNTER — Other Ambulatory Visit: Payer: Self-pay | Admitting: Surgery

## 2017-07-03 DIAGNOSIS — C187 Malignant neoplasm of sigmoid colon: Secondary | ICD-10-CM | POA: Diagnosis not present

## 2017-07-15 DIAGNOSIS — Z1389 Encounter for screening for other disorder: Secondary | ICD-10-CM | POA: Diagnosis not present

## 2017-07-15 DIAGNOSIS — C189 Malignant neoplasm of colon, unspecified: Secondary | ICD-10-CM | POA: Diagnosis not present

## 2017-07-15 DIAGNOSIS — Z0000 Encounter for general adult medical examination without abnormal findings: Secondary | ICD-10-CM | POA: Diagnosis not present

## 2017-07-15 DIAGNOSIS — R740 Nonspecific elevation of levels of transaminase and lactic acid dehydrogenase [LDH]: Secondary | ICD-10-CM | POA: Diagnosis not present

## 2017-07-29 DIAGNOSIS — E291 Testicular hypofunction: Secondary | ICD-10-CM | POA: Diagnosis not present

## 2017-07-30 NOTE — Pre-Procedure Instructions (Addendum)
Derek [name]  07/30/2017      [id number] - [place], [place] - [address] [place] [address] Phone: [phone] Fax: [phone]  [facility] ([place]) [place], [place] [address] Phone: [phone] Fax: [facility] - [place], [place] [address] Phone: [phone] Fax: [phone]    Your procedure is scheduled on May 14  Report to [place] at [place].M.  Call this number if you have problems the morning of surgery:  [phone]   Remember:  DRINK 2 PRE-SURGERY ENSURES THE NIGHT BEFORE SURGERY   ONLY CLEAR LIQUIDS AFTER MIDNIGHT TO STAY HYDRATED   STOP CLEAR LIQUIDS AT 430 AM AND NOTHING TO EAT OR DRINK AFTER 430 AM  DRINK PRE-SURGERY ENSURE THE MORNING OF SURGERY BEFORE 0430 AM    Continue all medications as directed by your physician except follow these medication instructions before surgery below   Take these medicines the morning of surgery with A SIP OF WATER  ALPRAZolam (XANAX)  carvedilol (COREG) if still taking citalopram (CELEXA) Nebivolol HCl  omeprazole (PRILOSEC)   7 days prior to surgery STOP taking any Aspirin(unless otherwise instructed by your surgeon), Aleve, Naproxen, Ibuprofen, Motrin, Advil, Goody's, BC's, all herbal medications, fish oil, and all vitamins   Do not wear jewelry  Do not wear lotions, powders, or cologne, or deodorant.  Men may shave face and neck.  Do not bring valuables to the hospital.  [facility] is not responsible for any belongings or valuables.  Contacts, dentures or bridgework may not be worn into surgery.  Leave your suitcase in the car.  After surgery it may be brought to your room.  For patients admitted to the hospital, discharge time  will be determined by your treatment team.  Patients discharged the day of surgery will not be allowed to drive home.    Special instructions:   [place]- Preparing For Surgery  Before surgery, you can play an important role. Because skin is not sterile, your skin needs to be as free of germs as possible. You can reduce the number of germs on your skin by washing with CHG (chlorahexidine gluconate) Soap before surgery.  CHG is an antiseptic cleaner which kills germs and bonds with the skin to continue killing germs even after washing.  Oral Hygiene is also important to reduce your risk of infection.  Remember - brush your teeth the the day of surgery.  Please do not use if you have an allergy to CHG or antibacterial soaps. If your skin becomes reddened/irritated stop using the CHG.  Do not shave (including legs and underarms) for at least 48 hours prior to first CHG shower. It is OK to shave your face.  Please follow these instructions carefully.   1. Shower the NIGHT BEFORE SURGERY and the MORNING OF SURGERY with CHG.   2. If you chose to wash your hair, wash your hair first as usual with your normal shampoo.  3. After you shampoo, rinse your hair and body thoroughly to remove the shampoo.  4. Use CHG as you would any other liquid soap. You can apply CHG directly to the skin and wash gently with a scrungie or a clean washcloth.   5. Apply the CHG Soap to your body ONLY FROM THE  NECK DOWN.  Do not use on open wounds or open sores. Avoid contact with your eyes, ears, mouth and genitals (private parts). Wash Face and genitals (private parts)  with your normal soap.  6. Wash thoroughly, paying special attention to the area where your surgery will be performed.  7. Thoroughly rinse your body with warm water from the neck down.  8. DO NOT shower/wash with your normal soap after using and rinsing off the CHG Soap.  9. Pat yourself dry with a CLEAN TOWEL.  10. Wear CLEAN PAJAMAS to bed  the night before surgery, wear comfortable clothes the morning of surgery  11. Place CLEAN SHEETS on your bed the night of your first shower and DO NOT SLEEP WITH PETS.    Day of Surgery: Do not apply any deodorants/lotions. Please wear clean clothes to the hospital/surgery center.  Remember to brush your teeth.      Please read over the following fact sheets that you were given.

## 2017-07-31 ENCOUNTER — Encounter (HOSPITAL_COMMUNITY): Payer: Self-pay

## 2017-07-31 ENCOUNTER — Other Ambulatory Visit: Payer: Self-pay

## 2017-07-31 ENCOUNTER — Encounter (HOSPITAL_COMMUNITY)
Admission: RE | Admit: 2017-07-31 | Discharge: 2017-07-31 | Disposition: A | Discharge status: Home / Self Care | Payer: 59 | Source: Ambulatory Visit | Attending: Surgery | Admitting: Surgery

## 2017-07-31 DIAGNOSIS — Z01810 Encounter for preprocedural cardiovascular examination: Secondary | ICD-10-CM | POA: Insufficient documentation

## 2017-07-31 DIAGNOSIS — Z01812 Encounter for preprocedural laboratory examination: Secondary | ICD-10-CM | POA: Insufficient documentation

## 2017-07-31 HISTORY — DX: Sleep apnea, unspecified: G47.30

## 2017-07-31 HISTORY — DX: Malignant (primary) neoplasm, unspecified: C80.1

## 2017-07-31 HISTORY — DX: Malignant neoplasm of colon, unspecified: C18.9

## 2017-07-31 LAB — BASIC METABOLIC PANEL
Anion gap: 9 (ref 5–15)
BUN: 13 mg/dL (ref 6–20)
CALCIUM: 9.6 mg/dL (ref 8.9–10.3)
CHLORIDE: 103 mmol/L (ref 101–111)
CO2: 25 mmol/L (ref 22–32)
CREATININE: 1.06 mg/dL (ref 0.61–1.24)
GFR calc non Af Amer: >60 mL/min (ref >60)
GLUCOSE: 108 mg/dL — AB (ref 65–99)
Potassium: 4 mmol/L (ref 3.5–5.1)
Sodium: 137 mmol/L (ref 135–145)

## 2017-07-31 LAB — CBC
HCT: 43.9 % (ref 39.0–52.0)
HEMOGLOBIN: 15.4 g/dL (ref 13.0–17.0)
MCH: 31.2 pg (ref 26.0–34.0)
MCHC: 35.1 g/dL (ref 30.0–36.0)
MCV: 88.9 fL (ref 78.0–100.0)
PLATELETS: 188 10*3/uL (ref 150–400)
RBC: 4.94 MIL/uL (ref 4.22–5.81)
RDW: 12.3 % (ref 11.5–15.5)
WBC: 8.1 10*3/uL (ref 4.0–10.5)

## 2017-07-31 LAB — HEMOGLOBIN A1C
Hgb A1c MFr Bld: 5.8 % — ABNORMAL HIGH (ref 4.8–5.6)
MEAN PLASMA GLUCOSE: 119.76 mg/dL

## 2017-07-31 NOTE — Progress Notes (Signed)
PCP - Dr. Bevelyn Buckles  Cardiologist - Dr. Grayland Jack- Has not seen in 4 yrs  Chest x-ray - CT Chest 06/30/17 (E)  EKG - 07/31/17  Stress Test - 03/05/07 (E)  ECHO - 10/26/06 (E)  Cardiac Cath - Denies  Sleep Study - Yes- Positive CPAP - None  LABS- 07/31/17: CBC, BMP  ASA- Told to continue  HA1C- 07/31/17  Anesthesia- No  Pt denies having chest pain, sob, or fever at this time. All instructions explained to the pt, with a verbal understanding of the material. Pt agrees to go over the instructions while at home for a better understanding. The opportunity to ask questions was provided.

## 2017-08-04 ENCOUNTER — Encounter (HOSPITAL_COMMUNITY): Payer: Self-pay | Admitting: General Practice

## 2017-08-04 NOTE — H&P (Signed)
Derek [name]  Location: [facility] Surgery Patient #: [id number] DOB: 01/16/1966 Married / Language: English / Race: White Male   History of Present Illness (Uva Runkel A. Ninfa Linden MD; The patient is a 52 year old male who presents with colorectal cancer. This is a very pleasant patient referred by Dr. Wonda Horner for evaluation of colon cancer. He actually had a very small polyp in sigmoid colon found on screening colonoscopy. The final pathology after being sent to [facility] showed adenocarcinoma which was less than 1 mm from the base of the 8 mm polyp. There was adenoma at the base margin. He has been sent here to consider laparoscopic assisted partial colectomy. He is otherwise healthy without complaints. Bowel movements have been normal. His sisters had multiple polyps. His mother died of metastatic cancer of an uncertain primary source.   Past Surgical History Malachi Bonds, CMA;  Colon Polyp Removal - Colonoscopy  Foot Surgery  Right. Vasectomy   Diagnostic Studies History Malachi Bonds, CMA;  Colonoscopy  within last year  Allergies Sabino Gasser;  No Known Drug Allergies  Allergies Reconciled   Medication History   Testosterone Cypionate (200MG /ML Solution, Intramuscular) Active. ALPRAZolam (0.25MG  Tablet, Oral) Active. Bystolic (20MG  Tablet, Oral) Active. Gentle Laxative (5MG  Tablet DR, Oral) Active. Lisinopril-Hydrochlorothiazide (20-12.5MG  Tablet, Oral) Active. Omeprazole (20MG  Capsule DR, Oral) Active. PEG 3350-KCl-Na Bicarb-NaCl (420GM For Solution, Oral) Active. Simvastatin (40MG  Tablet, Oral) Active. Medications Reconciled  Social History Malachi Bonds, CMA;   Alcohol use  Heavy alcohol use. Caffeine use  Carbonated beverages. No drug use  Tobacco use  Former smoker.  Family History Malachi Bonds, CMA; Cancer  Mother. Colon Polyps  Sister. Depression  Sister. Heart Disease  Father. Hypertension  Father,  Mother. Kidney Disease  Father.  Other Problems  Anxiety Disorder  Gastroesophageal Reflux Disease  High blood pressure     Review of Systems (Chemira Jones CMA;  General Not Present- Appetite Loss, Chills, Fatigue, Fever, Night Sweats, Weight Gain and Weight Loss. Skin Not Present- Change in Wart/Mole, Dryness, Hives, Jaundice, New Lesions, Non-Healing Wounds, Rash and Ulcer. HEENT Present- Wears glasses/contact lenses. Not Present- Earache, Hearing Loss, Hoarseness, Nose Bleed, Oral Ulcers, Ringing in the Ears, Seasonal Allergies, Sinus Pain, Sore Throat, Visual Disturbances and Yellow Eyes. Respiratory Present- Snoring. Not Present- Bloody sputum, Chronic Cough, Difficulty Breathing and Wheezing. Breast Not Present- Breast Mass, Breast Pain, Nipple Discharge and Skin Changes. Cardiovascular Not Present- Chest Pain, Difficulty Breathing Lying Down, Leg Cramps, Palpitations, Rapid Heart Rate, Shortness of Breath and Swelling of Extremities. Gastrointestinal Not Present- Abdominal Pain, Bloating, Bloody Stool, Change in Bowel Habits, Chronic diarrhea, Constipation, Difficulty Swallowing, Excessive gas, Gets full quickly at meals, Hemorrhoids, Indigestion, Nausea, Rectal Pain and Vomiting. Male Genitourinary Not Present- Blood in Urine, Change in Urinary Stream, Frequency, Impotence, Nocturia, Painful Urination, Urgency and Urine Leakage. Musculoskeletal Not Present- Back Pain, Joint Pain, Joint Stiffness, Muscle Pain, Muscle Weakness and Swelling of Extremities. Neurological Not Present- Decreased Memory, Fainting, Headaches, Numbness, Seizures, Tingling, Tremor, Trouble walking and Weakness. Psychiatric Present- Anxiety. Not Present- Bipolar, Change in Sleep Pattern, Depression, Fearful and Frequent crying. Endocrine Not Present- Cold Intolerance, Excessive Hunger, Hair Changes, Heat Intolerance, Hot flashes and New Diabetes. Hematology Not Present- Blood Thinners, Easy Bruising,  Excessive bleeding, Gland problems, HIV and Persistent Infections.  Vitals Sabino Gasser; 07/03/2017 2:46 PM) 07/03/2017 2:45 PM Weight: 243.25 lb Height: 75in Body Surface Area: 2.38 m Body Mass Index: 30.4 kg/m  Temp.: 98.36F(Oral)  Pulse: 79 (Regular)  BP: 130/80 (  Sitting, Left Arm, Standard)       Physical Exam (Craven Crean A. Ninfa Linden MD;  General Mental Status-Alert. General Appearance-Consistent with stated age. Hydration-Well hydrated. Voice-Normal.  Head and Neck Head-normocephalic, atraumatic with no lesions or palpable masses. Trachea-midline. Thyroid Gland Characteristics - normal size and consistency.  Eye Eyeball - Bilateral-Extraocular movements intact. Sclera/Conjunctiva - Bilateral-No scleral icterus.  Chest and Lung Exam Chest and lung exam reveals -quiet, even and easy respiratory effort with no use of accessory muscles and on auscultation, normal breath sounds, no adventitious sounds and normal vocal resonance. Inspection Chest Wall - Normal. Back - normal.  Breast - Did not examine.  Cardiovascular Cardiovascular examination reveals -normal heart sounds, regular rate and rhythm with no murmurs and normal pedal pulses bilaterally.  Abdomen Inspection Inspection of the abdomen reveals - No Hernias. Skin - Scar - no surgical scars. Palpation/Percussion Palpation and Percussion of the abdomen reveal - Soft, Non Tender, No Rebound tenderness, No Rigidity (guarding) and No hepatosplenomegaly. Auscultation Auscultation of the abdomen reveals - Bowel sounds normal.  Neurologic - Did not examine.  Musculoskeletal - Did not examine.  Lymphatic - Did not examine.    Assessment & Plan   CANCER OF SIGMOID COLON (C18.7)  Impression: I have reviewed the pathology as well as Dr. Erlinda Hong colonoscopy report. He had a repeat colonoscopy in the area was tattooed. We discussed a laparoscopic assisted partial colectomy in  detail. We discussed the surgical risks. These include but are not limited to bleeding, infection, injury to surrounding structures, anastomotic leak, the need for further procedures, cardiopulmonary she's, DVT, postoperative recovery, etc. We discussed the reasons for doing the surgery because of the adenocarcinoma as well as adenoma at the margin. He and his wife understand he wished proceed with surgery which will be scheduled

## 2017-08-05 ENCOUNTER — Other Ambulatory Visit: Payer: Self-pay

## 2017-08-05 ENCOUNTER — Inpatient Hospital Stay (HOSPITAL_COMMUNITY): Payer: 59 | Admitting: Anesthesiology

## 2017-08-05 ENCOUNTER — Ambulatory Visit (HOSPITAL_COMMUNITY)
Admission: RE | Admit: 2017-08-05 | Discharge: 2017-08-05 | Disposition: A | Discharge status: Home / Self Care | Payer: 59 | Source: Ambulatory Visit | Attending: Surgery | Admitting: Surgery

## 2017-08-05 ENCOUNTER — Encounter (HOSPITAL_COMMUNITY): Payer: Self-pay | Admitting: General Practice

## 2017-08-05 ENCOUNTER — Encounter (HOSPITAL_COMMUNITY)
Admission: RE | Disposition: A | Discharge status: Home / Self Care | Payer: Self-pay | Source: Ambulatory Visit | Attending: Surgery

## 2017-08-05 DIAGNOSIS — Z8371 Family history of colonic polyps: Secondary | ICD-10-CM | POA: Diagnosis not present

## 2017-08-05 DIAGNOSIS — Z79899 Other long term (current) drug therapy: Secondary | ICD-10-CM | POA: Diagnosis not present

## 2017-08-05 DIAGNOSIS — C187 Malignant neoplasm of sigmoid colon: Secondary | ICD-10-CM | POA: Diagnosis not present

## 2017-08-05 DIAGNOSIS — Z9852 Vasectomy status: Secondary | ICD-10-CM | POA: Insufficient documentation

## 2017-08-05 DIAGNOSIS — I10 Essential (primary) hypertension: Secondary | ICD-10-CM | POA: Diagnosis not present

## 2017-08-05 DIAGNOSIS — E785 Hyperlipidemia, unspecified: Secondary | ICD-10-CM | POA: Diagnosis not present

## 2017-08-05 DIAGNOSIS — K219 Gastro-esophageal reflux disease without esophagitis: Secondary | ICD-10-CM | POA: Insufficient documentation

## 2017-08-05 DIAGNOSIS — C189 Malignant neoplasm of colon, unspecified: Secondary | ICD-10-CM | POA: Diagnosis not present

## 2017-08-05 DIAGNOSIS — Z87891 Personal history of nicotine dependence: Secondary | ICD-10-CM | POA: Diagnosis not present

## 2017-08-05 DIAGNOSIS — Z7982 Long term (current) use of aspirin: Secondary | ICD-10-CM | POA: Diagnosis not present

## 2017-08-05 HISTORY — PX: PROCTOSCOPY: SHX2266

## 2017-08-05 HISTORY — PX: LAPAROSCOPY: SHX197

## 2017-08-05 LAB — TYPE AND SCREEN
ABO/RH(D): O POS
ANTIBODY SCREEN: NEGATIVE

## 2017-08-05 LAB — ABO/RH: ABO/RH(D): O POS

## 2017-08-05 SURGERY — LAPAROSCOPY, DIAGNOSTIC
Anesthesia: General | Site: Abdomen

## 2017-08-05 MED ORDER — LIDOCAINE 2% (20 MG/ML) 5 ML SYRINGE
INTRAMUSCULAR | Status: DC | PRN
  Administered 2017-08-05: 60 mg via INTRAVENOUS
  Administered 2017-08-05: 40 mg via INTRAVENOUS
  Administered 2017-08-05: 20 mg via INTRAVENOUS

## 2017-08-05 MED ORDER — DEXAMETHASONE SODIUM PHOSPHATE 10 MG/ML IJ SOLN
INTRAMUSCULAR | Status: AC
  Filled 2017-08-05: qty 1

## 2017-08-05 MED ORDER — BUPIVACAINE-EPINEPHRINE (PF) 0.25% -1:200000 IJ SOLN
INTRAMUSCULAR | Status: AC
  Filled 2017-08-05: qty 30

## 2017-08-05 MED ORDER — FENTANYL CITRATE (PF) 100 MCG/2ML IJ SOLN
INTRAMUSCULAR | Status: DC | PRN
  Administered 2017-08-05 (×3): 50 ug via INTRAVENOUS
  Administered 2017-08-05: 100 ug via INTRAVENOUS

## 2017-08-05 MED ORDER — PROPOFOL 10 MG/ML IV BOLUS
INTRAVENOUS | Status: AC
  Filled 2017-08-05: qty 20

## 2017-08-05 MED ORDER — ONDANSETRON HCL 4 MG/2ML IJ SOLN
4.0000 mg | Freq: Once | INTRAMUSCULAR | Status: DC | PRN
Start: 2017-08-05 — End: 2017-08-05

## 2017-08-05 MED ORDER — LIDOCAINE 2% (20 MG/ML) 5 ML SYRINGE
INTRAMUSCULAR | Status: AC
  Filled 2017-08-05: qty 5

## 2017-08-05 MED ORDER — 0.9 % SODIUM CHLORIDE (POUR BTL) OPTIME
TOPICAL | Status: DC | PRN
  Administered 2017-08-05 (×2): 1000 mL

## 2017-08-05 MED ORDER — CHLORHEXIDINE GLUCONATE CLOTH 2 % EX PADS: 6.0000 | MEDICATED_PAD | Freq: Once | CUTANEOUS | Status: DC

## 2017-08-05 MED ORDER — ALVIMOPAN 12 MG PO CAPS
12.0000 mg | ORAL_CAPSULE | ORAL | Status: AC
  Administered 2017-08-05: 12 mg via ORAL
  Filled 2017-08-05: qty 1

## 2017-08-05 MED ORDER — BUPIVACAINE-EPINEPHRINE 0.25% -1:200000 IJ SOLN
INTRAMUSCULAR | Status: DC | PRN
  Administered 2017-08-05: 30 mL

## 2017-08-05 MED ORDER — ONDANSETRON HCL 4 MG/2ML IJ SOLN
INTRAMUSCULAR | Status: AC
  Filled 2017-08-05: qty 2

## 2017-08-05 MED ORDER — OXYCODONE HCL 5 MG/5ML PO SOLN: 5.0000 mg | Freq: Once | ORAL | Status: AC | PRN

## 2017-08-05 MED ORDER — MIDAZOLAM HCL 2 MG/2ML IJ SOLN
INTRAMUSCULAR | Status: AC
  Filled 2017-08-05: qty 2

## 2017-08-05 MED ORDER — ONDANSETRON HCL 4 MG/2ML IJ SOLN
INTRAMUSCULAR | Status: DC | PRN
  Administered 2017-08-05: 4 mg via INTRAVENOUS

## 2017-08-05 MED ORDER — BUPIVACAINE LIPOSOME 1.3 % IJ SUSP
20.0000 mL | INTRAMUSCULAR | Status: AC
  Administered 2017-08-05: 20 mL
  Filled 2017-08-05: qty 20

## 2017-08-05 MED ORDER — CELECOXIB 200 MG PO CAPS
200.0000 mg | ORAL_CAPSULE | ORAL | Status: AC
  Administered 2017-08-05: 200 mg via ORAL
  Filled 2017-08-05: qty 1

## 2017-08-05 MED ORDER — ROCURONIUM BROMIDE 50 MG/5ML IV SOLN
INTRAVENOUS | Status: AC
  Filled 2017-08-05: qty 1

## 2017-08-05 MED ORDER — OXYCODONE HCL 5 MG PO TABS: 5.0000 mg | ORAL_TABLET | Freq: Four times a day (QID) | ORAL | 0 refills | Status: DC | PRN

## 2017-08-05 MED ORDER — PROPOFOL 10 MG/ML IV BOLUS
INTRAVENOUS | Status: DC | PRN
  Administered 2017-08-05: 180 mg via INTRAVENOUS

## 2017-08-05 MED ORDER — MIDAZOLAM HCL 5 MG/5ML IJ SOLN
INTRAMUSCULAR | Status: DC | PRN
  Administered 2017-08-05: 2 mg via INTRAVENOUS

## 2017-08-05 MED ORDER — DEXAMETHASONE SODIUM PHOSPHATE 10 MG/ML IJ SOLN
INTRAMUSCULAR | Status: DC | PRN
  Administered 2017-08-05: 10 mg via INTRAVENOUS

## 2017-08-05 MED ORDER — STERILE WATER FOR IRRIGATION IR SOLN
Status: DC | PRN
  Administered 2017-08-05: 1000 mL

## 2017-08-05 MED ORDER — ACETAMINOPHEN 500 MG PO TABS
1000.0000 mg | ORAL_TABLET | ORAL | Status: AC
  Administered 2017-08-05: 1000 mg via ORAL
  Filled 2017-08-05: qty 2

## 2017-08-05 MED ORDER — GABAPENTIN 300 MG PO CAPS
300.0000 mg | ORAL_CAPSULE | ORAL | Status: AC
  Administered 2017-08-05: 300 mg via ORAL
  Filled 2017-08-05: qty 1

## 2017-08-05 MED ORDER — FENTANYL CITRATE (PF) 250 MCG/5ML IJ SOLN
INTRAMUSCULAR | Status: AC
  Filled 2017-08-05: qty 5

## 2017-08-05 MED ORDER — OXYCODONE HCL 5 MG PO TABS
ORAL_TABLET | ORAL | Status: AC
  Filled 2017-08-05: qty 1

## 2017-08-05 MED ORDER — LACTATED RINGERS IV SOLN
INTRAVENOUS | Status: DC | PRN
  Administered 2017-08-05 (×2): via INTRAVENOUS

## 2017-08-05 MED ORDER — HYDROMORPHONE HCL 2 MG/ML IJ SOLN
INTRAMUSCULAR | Status: AC
  Filled 2017-08-05: qty 1

## 2017-08-05 MED ORDER — CEFOTETAN DISODIUM-DEXTROSE 2-2.08 GM-%(50ML) IV SOLR
2.0000 g | INTRAVENOUS | Status: AC
  Administered 2017-08-05: 2 g via INTRAVENOUS
  Filled 2017-08-05: qty 50

## 2017-08-05 MED ORDER — SUGAMMADEX SODIUM 200 MG/2ML IV SOLN
INTRAVENOUS | Status: DC | PRN
  Administered 2017-08-05: 200 mg via INTRAVENOUS

## 2017-08-05 MED ORDER — OXYCODONE HCL 5 MG PO TABS
5.0000 mg | ORAL_TABLET | Freq: Once | ORAL | Status: AC | PRN
  Administered 2017-08-05: 5 mg via ORAL

## 2017-08-05 MED ORDER — SODIUM CHLORIDE 0.9 % IR SOLN
Status: DC | PRN
  Administered 2017-08-05: 1000 mL

## 2017-08-05 MED ORDER — NEOSTIGMINE METHYLSULFATE 5 MG/5ML IV SOSY
PREFILLED_SYRINGE | INTRAVENOUS | Status: AC
  Filled 2017-08-05: qty 5

## 2017-08-05 MED ORDER — HYDROMORPHONE HCL 2 MG/ML IJ SOLN
0.3000 mg | INTRAMUSCULAR | Status: DC | PRN
  Administered 2017-08-05: 0.5 mg via INTRAVENOUS

## 2017-08-05 MED ORDER — ROCURONIUM BROMIDE 10 MG/ML (PF) SYRINGE
PREFILLED_SYRINGE | INTRAVENOUS | Status: DC | PRN
  Administered 2017-08-05: 20 mg via INTRAVENOUS
  Administered 2017-08-05: 50 mg via INTRAVENOUS

## 2017-08-05 SURGICAL SUPPLY — 69 items
ADH SKN CLS APL DERMABOND .7 (GAUZE/BANDAGES/DRESSINGS) ×2
APPLIER CLIP ROT 10 11.4 M/L (STAPLE)
APR CLP MED LRG 11.4X10 (STAPLE)
BLADE CLIPPER SURG (BLADE) ×2 IMPLANT
BLADE SURG 10 STRL SS (BLADE) ×3 IMPLANT
CANISTER SUCT 3000ML PPV (MISCELLANEOUS) ×3 IMPLANT
CELLS DAT CNTRL 66122 CELL SVR (MISCELLANEOUS) ×2 IMPLANT
CHLORAPREP W/TINT 26ML (MISCELLANEOUS) ×3 IMPLANT
CLIP APPLIE ROT 10 11.4 M/L (STAPLE) IMPLANT
COVER MAYO STAND STRL (DRAPES) ×3 IMPLANT
COVER SURGICAL LIGHT HANDLE (MISCELLANEOUS) ×6 IMPLANT
DERMABOND ADVANCED (GAUZE/BANDAGES/DRESSINGS) ×1
DERMABOND ADVANCED .7 DNX12 (GAUZE/BANDAGES/DRESSINGS) ×1 IMPLANT
DRAPE HALF SHEET 40X57 (DRAPES) ×4 IMPLANT
DRAPE UTILITY XL STRL (DRAPES) ×5 IMPLANT
DRAPE WARM FLUID 44X44 (DRAPE) ×3 IMPLANT
DRSG OPSITE POSTOP 4X10 (GAUZE/BANDAGES/DRESSINGS) IMPLANT
DRSG OPSITE POSTOP 4X8 (GAUZE/BANDAGES/DRESSINGS) IMPLANT
ELECT BLADE 6.5 EXT (BLADE) ×3 IMPLANT
ELECT CAUTERY BLADE 6.4 (BLADE) ×6 IMPLANT
ELECT REM PT RETURN 9FT ADLT (ELECTROSURGICAL) ×3
ELECTRODE REM PT RTRN 9FT ADLT (ELECTROSURGICAL) ×2 IMPLANT
GEL ULTRASOUND 20GR AQUASONIC (MISCELLANEOUS) ×6 IMPLANT
GLOVE SURG SIGNA 7.5 PF LTX (GLOVE) ×6 IMPLANT
GOWN STRL REUS W/ TWL LRG LVL3 (GOWN DISPOSABLE) ×7 IMPLANT
GOWN STRL REUS W/ TWL XL LVL3 (GOWN DISPOSABLE) ×8 IMPLANT
GOWN STRL REUS W/TWL LRG LVL3 (GOWN DISPOSABLE) ×3
GOWN STRL REUS W/TWL XL LVL3 (GOWN DISPOSABLE) ×18
KIT BASIN OR (CUSTOM PROCEDURE TRAY) ×3 IMPLANT
LEGGING LITHOTOMY PAIR STRL (DRAPES) ×2 IMPLANT
LIGASURE IMPACT 36 18CM CVD LR (INSTRUMENTS) IMPLANT
NS IRRIG 1000ML POUR BTL (IV SOLUTION) ×6 IMPLANT
PAD ARMBOARD 7.5X6 YLW CONV (MISCELLANEOUS) ×6 IMPLANT
PENCIL BUTTON HOLSTER BLD 10FT (ELECTRODE) ×6 IMPLANT
RETRACTOR WND ALEXIS 18 MED (MISCELLANEOUS) IMPLANT
RTRCTR WOUND ALEXIS 18CM MED (MISCELLANEOUS) ×3
SCISSORS LAP 5X35 DISP (ENDOMECHANICALS) ×3 IMPLANT
SET IRRIG TUBING LAPAROSCOPIC (IRRIGATION / IRRIGATOR) ×2 IMPLANT
SHEARS HARMONIC ACE PLUS 36CM (ENDOMECHANICALS) ×3 IMPLANT
SLEEVE ENDOPATH XCEL 5M (ENDOMECHANICALS) ×3 IMPLANT
SPECIMEN JAR LARGE (MISCELLANEOUS) ×3 IMPLANT
STAPLER VISISTAT 35W (STAPLE) ×3 IMPLANT
SUCTION POOLE TIP (SUCTIONS) ×3 IMPLANT
SURGILUBE 2OZ TUBE FLIPTOP (MISCELLANEOUS) IMPLANT
SUT MNCRL AB 4-0 PS2 18 (SUTURE) ×2 IMPLANT
SUT PDS AB 1 TP1 96 (SUTURE) ×6 IMPLANT
SUT PROLENE 2 0 CT2 30 (SUTURE) ×2 IMPLANT
SUT PROLENE 2 0 KS (SUTURE) IMPLANT
SUT SILK 2 0 SH CR/8 (SUTURE) ×3 IMPLANT
SUT SILK 2 0 TIES 10X30 (SUTURE) ×3 IMPLANT
SUT SILK 3 0 SH CR/8 (SUTURE) ×3 IMPLANT
SUT SILK 3 0 TIES 10X30 (SUTURE) ×3 IMPLANT
SYR BULB IRRIGATION 50ML (SYRINGE) ×3 IMPLANT
SYS LAPSCP GELPORT 120MM (MISCELLANEOUS)
SYSTEM LAPSCP GELPORT 120MM (MISCELLANEOUS) IMPLANT
TOWEL OR 17X26 10 PK STRL BLUE (TOWEL DISPOSABLE) ×6 IMPLANT
TRAY FOLEY MTR SLVR 16FR STAT (SET/KITS/TRAYS/PACK) ×1 IMPLANT
TRAY LAPAROSCOPIC MC (CUSTOM PROCEDURE TRAY) ×3 IMPLANT
TRAY PROCTOSCOPIC FIBER OPTIC (SET/KITS/TRAYS/PACK) ×4 IMPLANT
TROCAR XCEL 12X100 BLDLESS (ENDOMECHANICALS) IMPLANT
TROCAR XCEL BLUNT TIP 100MML (ENDOMECHANICALS) ×2 IMPLANT
TROCAR XCEL NON-BLD 11X100MML (ENDOMECHANICALS) ×2 IMPLANT
TROCAR XCEL NON-BLD 5MMX100MML (ENDOMECHANICALS) ×3 IMPLANT
TUBE CONNECTING 12X1/4 (SUCTIONS) ×6 IMPLANT
TUBING INSUF HEATED (TUBING) ×1 IMPLANT
TUBING INSUFFLATION (TUBING) ×2 IMPLANT
UNDERPAD 30X30 (UNDERPADS AND DIAPERS) ×3 IMPLANT
WATER STERILE IRR 1000ML POUR (IV SOLUTION) ×3 IMPLANT
YANKAUER SUCT BULB TIP NO VENT (SUCTIONS) ×6 IMPLANT

## 2017-08-05 NOTE — Op Note (Signed)
LAPAROSCOPY DIAGNOSTIC, RIGID PROCTOSCOPY  Procedure Note  Derek [name] 08/05/2017   Pre-op Diagnosis: COLON CANCER     Post-op Diagnosis: same  Procedure(s): LAPAROSCOPY DIAGNOSTIC RIGID PROCTOSCOPY  Surgeon(s): Coralie Keens, MD Armandina Gemma, MD  Anesthesia: General  Staff:  Circulator: Paulette Blanch, RN Scrub Person: Loreli Slot, RN Circulator Assistant: Nicholos Johns, RN  Estimated Blood Loss: Minimal               Indications: This is [name] 52 year old gentleman who had undergone [name] screening colonoscopy.  He was found to have an 8 mm polyp in sigmoid colon which was removed.  Final pathology was concerning for an adenocarcinoma which was confirmed at [facility].  The adenocarcinoma was 1 mm from the base of the polyp and there was adenoma at the base.  After long discussion with the patient the decision was made to proceed with [name] lap scopic assisted partial colectomy  Findings: On diagnostic laparoscopy, no area of tattooing could be identified.  I discussed this intraoperatively with the patient's gastroenterologist, Dr. Paulita Fujita who reviewed his notes.  The lesion was confirmed to have been tattooed had [name] second endoscopy procedure in the mid sigmoid colon.  We performed proctoscopy and were unable to identify [name] lesion or tattoo.  After discussion with Dr. Paulita Fujita all my partner who was assisting, the decision was made to terminate the procedure with planned endoscopy at [name] later date.  Procedure: The patient was brought to the operating room and identified as correct patient.  He was placed supine on the operating room table and general anesthesia was induced.  The patient was then placed in the lithotomy position.  His abdomen and perineum were then prepped and draped in usual sterile fashion.  I made an incision with [name] scalpel above the umbilicus.  I dissected down to the fascia.  I then gained entrance to the peritoneal cavity.  [name] 0 Vicryl  pursestring suture was placed around the fascial opening.  The [facility] port was placed in the opening and insufflation of the abdomen was begun.  I then placed two 5 mm trochars in the patient's right abdomen under direct vision after incision with [name] scalpel.  The patient had an easily visible redundant loop of sigmoid colon.  There was some proximal sigmoid colon stuck to the pelvic sidewall which I took down with the harmonic scalpel.  I was able to identify the entire descending colon from the splenic flexure to the peritoneal reflection and rectum and could not identify any area of tattooing.  The transverse colon was also evaluated and I could not identify tattooing as well.  I then discussed this by phone with the patient's gastroenterologist who again reviewed his notes.  He confirmed that during the sigmoid colon at approximately 23 cm.  We performed [name] rigid proctoscopy I could not identify any tattooing of the previous polypectomy site.  At this point, the decision was made to terminate procedure. The abdomen was deflated.  All trochars were removed under direct vision.  I tied off the 0 Vicryl at the umbilical incision.  I then injected anesthetic into all incisions and closed them with 4-0 Monocryl and Dermabond.  The patient tolerated procedure well.  All the counts were correct at the end the procedure.  Patient was then extubated in the operating room and taken in [name] stable condition to the recovery room.          Derek [name]  Date: 08/05/2017  Time: 9:01 AM

## 2017-08-05 NOTE — Anesthesia Postprocedure Evaluation (Signed)
Anesthesia Post Note  Patient: Derek [name]  Procedure(s) Performed: LAPAROSCOPY DIAGNOSTIC (N/A Abdomen) RIGID PROCTOSCOPY (N/A Abdomen)     Patient location during evaluation: PACU Anesthesia Type: General Level of consciousness: awake and alert Pain management: pain level controlled Vital Signs Assessment: post-procedure vital signs reviewed and stable Respiratory status: spontaneous breathing, nonlabored ventilation, respiratory function stable and patient connected to nasal cannula oxygen Cardiovascular status: blood pressure returned to baseline and stable Postop Assessment: no apparent nausea or vomiting Anesthetic complications: no    Last Vitals:  Vitals:   08/05/17 1000 08/05/17 1014  BP: 137/74 (!) 148/87  Pulse: 79 80  Resp: 19 18  Temp: 36.6 C   SpO2: 97% 97%    Last Pain:  Vitals:   08/05/17 0915  TempSrc:   PainSc: 0-No pain                 Nashly Olsson COKER

## 2017-08-05 NOTE — Anesthesia Preprocedure Evaluation (Signed)
Anesthesia Evaluation  Patient identified by MRN, date of birth, ID band Patient awake    Reviewed: Allergy & Precautions, NPO status , Patient's Chart, lab work & pertinent test results  Airway Mallampati: II  TM Distance: >3 FB     Dental  (+) Teeth Intact, Dental Advisory Given   Pulmonary    breath sounds clear to auscultation       Cardiovascular hypertension,  Rhythm:Regular Rate:Normal     Neuro/Psych    GI/Hepatic   Endo/Other    Renal/GU      Musculoskeletal   Abdominal   Peds  Hematology   Anesthesia Other Findings   Reproductive/Obstetrics                             Anesthesia Physical Anesthesia Plan  ASA: II  Anesthesia Plan: General   Post-op Pain Management:    Induction: Intravenous  PONV Risk Score and Plan: Ondansetron and Dexamethasone  Airway Management Planned: Oral ETT  Additional Equipment:   Intra-op Plan:   Post-operative Plan: Extubation in OR  Informed Consent: I have reviewed the patients History and Physical, chart, labs and discussed the procedure including the risks, benefits and alternatives for the proposed anesthesia with the patient or authorized representative who has indicated his/her understanding and acceptance.   Dental advisory given  Plan Discussed with: CRNA and Anesthesiologist  Anesthesia Plan Comments: (Colon Ca Htn GERD  Plan GA with oral ETT )        Anesthesia Quick Evaluation

## 2017-08-05 NOTE — Discharge Summary (Signed)
  DX:  Colon mass  Patient went home from the PACU after diagnostic laparoscopy.  He was never admitted. He was doing well  Follow up:  My office in 3 weeks

## 2017-08-05 NOTE — Interval H&P Note (Signed)
History and Physical Interval Note:no change in H and P  08/05/2017 7:06 AM  Derek [name]  has presented today for surgery, with the diagnosis of colon cancer  The various methods of treatment have been discussed with the patient and family. After consideration of risks, benefits and other options for treatment, the patient has consented to  Procedure(s): [place] (N/A) as a surgical intervention .  The patient's history has been reviewed, patient examined, no change in status, stable for surgery.  I have reviewed the patient's chart and labs.  Questions were answered to the patient's satisfaction.     Tyja Gortney A

## 2017-08-05 NOTE — Discharge Instructions (Signed)
[facility] ______[facility] LAPAROSCOPIC SURGERY: POST OP INSTRUCTIONS Always review your discharge instruction sheet given to you by the facility where your surgery was performed. IF YOU HAVE DISABILITY OR FAMILY LEAVE FORMS, YOU MUST BRING THEM TO THE OFFICE FOR PROCESSING.   DO NOT GIVE THEM TO YOUR DOCTOR.  1. A prescription for pain medication may be given to you upon discharge.  Take your pain medication as prescribed, if needed.  If narcotic pain medicine is not needed, then you may take acetaminophen (Tylenol) or ibuprofen (Advil) as needed. 2. Take your usually prescribed medications unless otherwise directed. 3. If you need a refill on your pain medication, please contact your pharmacy.  They will contact our office to request authorization. Prescriptions will not be filled after 5pm or on week-ends. 4. You should follow a light diet the first few days after arrival home, such as soup and crackers, etc.  Be sure to include lots of fluids daily. 5. Most patients will experience some swelling and bruising in the area of the incisions.  Ice packs will help.  Swelling and bruising can take several days to resolve.  6. It is common to experience some constipation if taking pain medication after surgery.  Increasing fluid intake and taking a stool softener (such as Colace) will usually help or prevent this problem from occurring.  A mild laxative (Milk of Magnesia or Miralax) should be taken according to package instructions if there are no bowel movements after 48 hours. 7. Unless discharge instructions indicate otherwise, you may remove your bandages 24-48 hours after surgery, and you may shower at that time.  You may have steri-strips (small skin tapes) in place directly over the incision.  These strips should be left on the skin for 7-10 days.  If your surgeon used skin glue on the incision, you may shower in 24 hours.  The glue will flake off over the next 2-3 weeks.  Any sutures or  staples will be removed at the office during your follow-up visit. 8. ACTIVITIES:  You may resume regular (light) daily activities beginning the next day--such as daily self-care, walking, climbing stairs--gradually increasing activities as tolerated.  You may have sexual intercourse when it is comfortable.  Refrain from any heavy lifting or straining until approved by your doctor. a. You may drive when you are no longer taking prescription pain medication, you can comfortably wear a seatbelt, and you can safely maneuver your car and apply brakes. b. RETURN TO WORK:  __________________________________________________________ 9. You should see your doctor in the office for a follow-up appointment approximately 2-3 weeks after your surgery.  Make sure that you call for this appointment within a day or two after you arrive home to insure a convenient appointment time. 10. OTHER INSTRUCTIONS:OK TO SHOWER STARTING TOMORROW 11. NO LIFTING MORE THAN 15 TO 20 POUNDS FOR 2 WEEKS 12. ICE PACK, TYLENOL, IBUPROFEN ALSO FOR PAIN __________________________________________________________________________________________________________________________ __________________________________________________________________________________________________________________________ WHEN TO CALL YOUR DOCTOR: 1. Fever over 101.0 2. Inability to urinate 3. Continued bleeding from incision. 4. Increased pain, redness, or drainage from the incision. 5. Increasing abdominal pain  The clinic staff is available to answer your questions during regular business hours.  Please dont hesitate to call and ask to speak to one of the nurses for clinical concerns.  If you have a medical emergency, go to the nearest emergency room or call 911.  A surgeon from [facility] is always on call at the hospital. [address] [employer]  [address], [place], [place]  [address] ? [address] [address], [place], [place]   [address] °[phone] ?  [phone] ? FAX [phone] °Web site: [email or web address] °

## 2017-08-05 NOTE — Transfer of Care (Signed)
Immediate Anesthesia Transfer of Care Note  Patient: Derek [name]  Procedure(s) Performed: LAPAROSCOPY DIAGNOSTIC (N/A Abdomen) RIGID PROCTOSCOPY (N/A Abdomen)  Patient Location: PACU  Anesthesia Type:General  Level of Consciousness: patient cooperative and responds to stimulation  Airway & Oxygen Therapy: Patient Spontanous Breathing and Patient connected to nasal cannula oxygen  Post-op Assessment: Report given to RN and Post -op Vital signs reviewed and stable  Post vital signs: Reviewed and stable  Last Vitals:  Vitals Value Taken Time  BP 164/95 08/05/2017  9:13 AM  Temp    Pulse 82 08/05/2017  9:17 AM  Resp 18 08/05/2017  9:17 AM  SpO2 100 % 08/05/2017  9:17 AM  Vitals shown include unvalidated device data.  Last Pain:  Vitals:   08/05/17 0636  TempSrc:   PainSc: 0-No pain         Complications: No apparent anesthesia complications

## 2017-08-06 ENCOUNTER — Encounter (HOSPITAL_COMMUNITY): Payer: Self-pay | Admitting: Surgery

## 2017-08-15 DIAGNOSIS — L821 Other seborrheic keratosis: Secondary | ICD-10-CM | POA: Diagnosis not present

## 2017-08-15 DIAGNOSIS — L570 Actinic keratosis: Secondary | ICD-10-CM | POA: Diagnosis not present

## 2017-08-15 DIAGNOSIS — L814 Other melanin hyperpigmentation: Secondary | ICD-10-CM | POA: Diagnosis not present

## 2017-08-15 DIAGNOSIS — Z85828 Personal history of other malignant neoplasm of skin: Secondary | ICD-10-CM | POA: Diagnosis not present

## 2017-08-19 DIAGNOSIS — E291 Testicular hypofunction: Secondary | ICD-10-CM | POA: Diagnosis not present

## 2017-08-19 DIAGNOSIS — C189 Malignant neoplasm of colon, unspecified: Secondary | ICD-10-CM | POA: Diagnosis not present

## 2017-08-19 DIAGNOSIS — R55 Syncope and collapse: Secondary | ICD-10-CM | POA: Diagnosis not present

## 2017-09-09 DIAGNOSIS — E291 Testicular hypofunction: Secondary | ICD-10-CM | POA: Diagnosis not present

## 2017-09-11 ENCOUNTER — Telehealth: Payer: Self-pay | Admitting: Internal Medicine

## 2017-09-11 ENCOUNTER — Other Ambulatory Visit: Payer: Self-pay

## 2017-09-11 DIAGNOSIS — C189 Malignant neoplasm of colon, unspecified: Secondary | ICD-10-CM

## 2017-09-11 NOTE — Telephone Encounter (Signed)
I received an email communication from the Derek [name] requesting that I call him for advice. I did call and speak with the Derek [name] yesterday evening He was diagnosed with colon cancer arising from an approximate 8 mm sigmoid polyp which was removed by hot snare in March 2019.  I have reviewed the colonoscopy report but do not have the pathology records.  Apparently the pathology was also reviewed at [facility].  Derek [name] recalls being told the resection margin was 1 mm Dr. Paulita Fujita performed initial colonoscopy, brought the Derek [name] back in early April 2019 to tattoo the post polypectomy scar for segmental resection. He was then seen by Dr. Ninfa Linden and taken to the OR but during the operation the mucosal tattoo was not found and resection was not performed. Since the aborted operation nothing further has been done to this point.  Derek [name] requesting my advice and involvement with his care.  He was not seen by oncology though I told him his case was very likely discussed at multidisciplinary cancer conference.  At this point it seems that decision needs to be made as to the next course of action.  If margins were 1 mm then he may benefit from a segmental resection.  He prefers segmental resection.  He also would value an oncology opinion.  I will refer him to see Dr. Benay Spice for his oncology opinion, though I explained very unlikely that he would need chemotherapy.    Thereafter if he is unable to contact Dr. Paulita Fujita, I am happy to repeat flexible sigmoidoscopy for more extensive mucosal tattooing and then referral back to Dr. Ninfa Linden for segmental resection of the sigmoid colon  Time provided for questions and answers, he thanked me for the call  Vaughan Basta, Please refer to Julieanne Manson for sigmoid colon cancer (diagnosis by Dr. Paulita Fujita at colonoscopy in March 2019). I have copies of his colonoscopy and flexible sigmoidoscopy report which I will send you, but not pathology. Thanks  [employer]

## 2017-09-11 NOTE — Telephone Encounter (Signed)
Referral entered in epic. 

## 2017-09-16 ENCOUNTER — Encounter: Payer: Self-pay | Admitting: Oncology

## 2017-09-16 ENCOUNTER — Telehealth: Payer: Self-pay | Admitting: Oncology

## 2017-09-16 ENCOUNTER — Telehealth: Payer: Self-pay | Admitting: *Deleted

## 2017-09-16 NOTE — Telephone Encounter (Signed)
Seth Bake at the [facility] has already scheduled patient this morning to see Dr Benay Spice on 09/30/17 at 2:00 pm. She states she has left a voicemail for patient and will send him some new patient paperwork. I will also reach out to him to advise him of appointment.   Your FIRST available appointment is 11/10/17 for any procedure in [place]. Let me know a good date for add on procedure and we can add patient on for flex.

## 2017-09-16 NOTE — Telephone Encounter (Signed)
-----   Message from Jerene Bears, MD sent at 09/15/2017  6:54 PM EDT ----- Regarding: Cancer center referral Has been referred to see [facility] for colon cancer Can you follow this up and see if he can get an appt soon Thanks Also, we will need to find time for a flex sig for additional bowel tattooing JMP

## 2017-09-16 NOTE — Telephone Encounter (Signed)
We will get him in  thanks

## 2017-09-16 NOTE — Telephone Encounter (Signed)
Patient aware of appointment with Dr. Benay Spice We can decide when to perform flex sig for additional tattooing once he meets with oncology. Patient aware that this is the plan

## 2017-09-16 NOTE — Telephone Encounter (Signed)
New referral received from Dr. Hilarie Fredrickson for dx of colon cancer. Pt has been scheduled to see Dr. Benay Spice on 7/9 at 2pm. Lft the appt date and time on the pt's voicemail. Will contact the referring office to notify the pt. Letter mailed.

## 2017-09-30 ENCOUNTER — Inpatient Hospital Stay: Payer: 59 | Attending: Oncology | Admitting: Oncology

## 2017-09-30 ENCOUNTER — Telehealth: Payer: Self-pay | Admitting: Oncology

## 2017-09-30 VITALS — BP 144/77 | HR 81 | Temp 98.6°F | Resp 18 | Ht 75.0 in | Wt 245.6 lb

## 2017-09-30 DIAGNOSIS — G4730 Sleep apnea, unspecified: Secondary | ICD-10-CM

## 2017-09-30 DIAGNOSIS — C187 Malignant neoplasm of sigmoid colon: Secondary | ICD-10-CM | POA: Diagnosis present

## 2017-09-30 DIAGNOSIS — K219 Gastro-esophageal reflux disease without esophagitis: Secondary | ICD-10-CM | POA: Insufficient documentation

## 2017-09-30 DIAGNOSIS — Z801 Family history of malignant neoplasm of trachea, bronchus and lung: Secondary | ICD-10-CM | POA: Diagnosis not present

## 2017-09-30 DIAGNOSIS — E785 Hyperlipidemia, unspecified: Secondary | ICD-10-CM | POA: Diagnosis not present

## 2017-09-30 DIAGNOSIS — M109 Gout, unspecified: Secondary | ICD-10-CM | POA: Diagnosis not present

## 2017-09-30 DIAGNOSIS — Z79899 Other long term (current) drug therapy: Secondary | ICD-10-CM | POA: Diagnosis not present

## 2017-09-30 DIAGNOSIS — F419 Anxiety disorder, unspecified: Secondary | ICD-10-CM | POA: Diagnosis not present

## 2017-09-30 DIAGNOSIS — I10 Essential (primary) hypertension: Secondary | ICD-10-CM | POA: Diagnosis not present

## 2017-09-30 NOTE — Telephone Encounter (Signed)
Scheduled appt per 7/9 los - sent reminder letter in the mail .

## 2017-09-30 NOTE — Progress Notes (Signed)
[facility] New Patient Consult   Referring MD: Derek [name] 52 y.o.  05-08-1965    Reason for Referral: Colon cancer   HPI: Derek [name] underwent his first screening colonoscopy on 06/09/2017.  3 sessile polyps were removed from the transverse and ascending colon.  Resection was complete.  The polyp tissue was partially retrieved.  An 8 mm polyp was found in the sigmoid colon.  The polyp was removed.  Resection and retrieval were complete.  The pathology from [name] [phone]) confirmed tubular adenomas involving the transverse and descending colon polyps.  The sigmoid colon polyp returned as an invasive well-differentiated adenocarcinoma in a tubular adenoma with high-grade dysplasia.  Invasive carcinoma is less than 1 mm from the deep margin.  No lymphovascular invasion.  No poorly differentiated component.  Adenoma was present at the lateral margin.  No loss of mismatch repair protein expression.  Staging CTs on 06/30/2017 revealed no evidence of metastatic disease.  No adenopathy.  No evidence of a primary tumor in the sigmoid colon.  He underwent a flexible sigmoidoscopy by Dr. Paulita [name] on 06/24/2017.  A single ulcer was found in the sigmoid colon at 23 cm from the anal verge.  The area was tattooed.  He was referred to Dr. Ninfa [name] and taken to the operating room on 08/05/2017.  The tattoo could not be identified.  The procedure was aborted.  Derek [name] reports feeling well prior to the colonoscopy in March.  He has transferred gastroenterology care to Dr. Hilarie [name].    Past Medical History:  Diagnosis Date  . Anxiety   .    [facility] Colon cancer (HCC)-invasive adenocarcinoma and a sigmoid colon polyp  06/09/2017  . GERD (gastroesophageal reflux disease)   . HLD (hyperlipidemia)   . Hypertension   . Sleep apnea    Does not use cpap    .  Low testosterone level     Past Surgical History:  Procedure Laterality Date  . ANKLE SURGERY     right  . LAPAROSCOPY N/A 08/05/2017   Procedure: LAPAROSCOPY DIAGNOSTIC;  Surgeon: Derek Keens, MD;  Location: [facility];  Service: General;  Laterality: N/A;  . PROCTOSCOPY N/A 08/05/2017   Procedure: RIGID PROCTOSCOPY;  Surgeon: Derek Keens, MD;  Location: [facility];  Service: General;  Laterality: N/A;    Medications: Reviewed  Allergies: No Known Allergies  Family history: Mother died of lung cancer and was a smoker, he notes that her physician mentioned she may have had cancer involving the jejunum.  He has 2 sisters and 2 children.  1 of his sister has a history of colon polyps.  Social History:   He lives in [place].  He works in [employer].  He does not use cigarettes.  He has 2 to 3 glasses of wine per day.  No risk factor for HIV or hepatitis.  ROS:   Positives include: One episode of rectal bleeding 7-8 years ago.  A complete ROS was otherwise negative.  Physical Exam:  Blood pressure (!) 144/77, pulse 81, temperature 98.6 F (37 C), temperature source Oral, resp. rate 18, height '6\' 3"'  (1.905 m), weight 245 lb 9.6 oz (111.4 kg), SpO2 97 %.  HEENT: Oropharynx without visible mass, neck without mass Lungs: Clear bilaterally Cardiac: Regular rate and rhythm Abdomen: No hepatosplenomegaly, no mass, healed surgical incisions, nontender GU: Testes without mass Vascular: No leg edema Lymph nodes: No cervical, supraclavicular, or inguinal nodes.  Prominent bilateral axillary fat pads versus soft  mobile 1 cm nodes in the high medial axillae Neurologic: Alert and oriented, the motor exam appears intact in the upper and lower extremities Skin: Erythematous acne type rash at the upper chest and back Musculoskeletal: No spine tenderness   LAB:  CBC  Lab Results  Component Value Date   WBC 8.1 07/31/2017   HGB 15.4 07/31/2017   HCT 43.9 07/31/2017   MCV 88.9 07/31/2017   PLT 188 07/31/2017   NEUTROABS 4.0 04/07/2014        CMP  Lab Results  Component Value Date     NA 137 07/31/2017   K 4.0 07/31/2017   CL 103 07/31/2017   CO2 25 07/31/2017   GLUCOSE 108 (H) 07/31/2017   BUN 13 07/31/2017   CREATININE 1.06 07/31/2017   CALCIUM 9.6 07/31/2017   GFRNONAA >60 07/31/2017   GFRAA >60 07/31/2017     Imaging: As per HPI   Assessment/Plan:   1. Invasive well-differentiated adenocarcinoma involving a sigmoid polyp (tubular adenoma with high-grade dysplasia)  Invasive adenocarcinoma less than 1 mm from the deep margin, no lymphovascular invasion, adenocarcinoma present at the lateral margin, tumor abutting present status post a polypectomy 06/09/2017  No loss of mismatch repair protein expression  Polypectomy site tattooed 06/24/2017  Staging CTs 06/30/2017- no primary tumor identified, no evidence of metastatic disease  Laparoscopy/proctoscopy 08/05/2017- polypectomy site could not be identified, planned colon resection aborted 2. Multiple colon polyps, tubular adenomas, identified on the colonoscopy 06/11/2017  3.   Hyperlipidemia  4.   Hypertension  5.   History of diabetes-currently maintained off of diabetes medications  6.  Gout  7.  Low testosterone  8.  History of anxiety   Disposition:   Derek [name] has been diagnosed with invasive adenocarcinoma involving a sigmoid colon polyp.  I discussed the diagnosis, prognosis, and treatment options with Derek [name] and his wife.  He has a good prognosis for a long-term disease-free survival.  There is no clinical or x-ray evidence of distant metastatic disease.  I recommend a partial colectomy based on the pathology findings.  He plans to see Dr. Hilarie [name] for a repeat sigmoidoscopy and tattooing of the polypectomy site.  He will see Dr. Ninfa [name] to plan surgery.  I will see him after surgery to review the pathology and discuss surveillance plans.  He does not appear to have hereditary non-polyposis colon cancer syndrome, but his family members are at increased risk of developing colon cancer  and should receive appropriate screening.  I recommend a preoperative CEA.  I will present his case at the GI tumor conference.  50 minutes were spent with the patient today.  The majority of the time was used for counseling and coordination of care.  Betsy Coder, MD  09/30/2017, 3:21 PM

## 2017-10-02 ENCOUNTER — Telehealth: Payer: Self-pay | Admitting: *Deleted

## 2017-10-02 ENCOUNTER — Emergency Department (HOSPITAL_COMMUNITY)
Admission: EM | Admit: 2017-10-02 | Discharge: 2017-10-03 | Disposition: A | Discharge status: Home / Self Care | Payer: 59 | Attending: Emergency Medicine | Admitting: Emergency Medicine

## 2017-10-02 ENCOUNTER — Other Ambulatory Visit: Payer: Self-pay

## 2017-10-02 ENCOUNTER — Encounter (HOSPITAL_COMMUNITY): Payer: Self-pay | Admitting: Emergency Medicine

## 2017-10-02 DIAGNOSIS — Z7982 Long term (current) use of aspirin: Secondary | ICD-10-CM | POA: Diagnosis not present

## 2017-10-02 DIAGNOSIS — I10 Essential (primary) hypertension: Secondary | ICD-10-CM | POA: Diagnosis not present

## 2017-10-02 DIAGNOSIS — Z23 Encounter for immunization: Secondary | ICD-10-CM | POA: Diagnosis not present

## 2017-10-02 DIAGNOSIS — Y999 Unspecified external cause status: Secondary | ICD-10-CM | POA: Diagnosis not present

## 2017-10-02 DIAGNOSIS — Y92009 Unspecified place in unspecified non-institutional (private) residence as the place of occurrence of the external cause: Secondary | ICD-10-CM | POA: Insufficient documentation

## 2017-10-02 DIAGNOSIS — Y93E5 Activity, floor mopping and cleaning: Secondary | ICD-10-CM | POA: Insufficient documentation

## 2017-10-02 DIAGNOSIS — Z85038 Personal history of other malignant neoplasm of large intestine: Secondary | ICD-10-CM | POA: Insufficient documentation

## 2017-10-02 DIAGNOSIS — S81811A Laceration without foreign body, right lower leg, initial encounter: Secondary | ICD-10-CM | POA: Diagnosis not present

## 2017-10-02 DIAGNOSIS — W25XXXA Contact with sharp glass, initial encounter: Secondary | ICD-10-CM | POA: Insufficient documentation

## 2017-10-02 DIAGNOSIS — Z79899 Other long term (current) drug therapy: Secondary | ICD-10-CM | POA: Insufficient documentation

## 2017-10-02 NOTE — Telephone Encounter (Signed)
-----   Message from Jerene Bears, MD sent at 10/02/2017  2:47 PM EDT ----- Carla Drape See below I need to arrange flex sig with full bowel prep (colonoscopy prep) on 10/22/17 in the [facility] at 1 PM Pt aware, but will need pre-visit and note to take full prep. Indication: hx of colon cancer JMP  ----- Message ----- From: Coralie Keens, MD Sent: 10/02/2017   8:22 AM To: Jerene Bears, MD  Yes, I'm fine with tattooing just distal  I saw the endo pics from the tattoo originally and it seemed perfect so I was shocked that we couldn't see anything at all.  We couldn't get far up enough with the procto and his prep was not great at all.  He said his previous preps were crystal clear at the end but the one for surgery just didn't go well. He does have a nice redundant loop of sigmoid colon which is where this thing was supposed to be.  Thanks for seeing him.  He's a real nice guy  ----- Message ----- From: Jerene Bears, MD Sent: 10/01/2017   2:21 PM To: Coralie Keens, MD, Arna Snipe, RN, #  Brad Thanks so much for seeing him I am recently involved as the patient called me directly.  I do not think the polypectomy site will be visible, but it was tattooed before, so I should be able to find it endoscopically.  My presumption is that Marden Noble could not see it intraoperatively because it is on the mesenteric side.  My plan will be to place 4 quadrant tattoos at repeat flex sig (assuming I can find the old one).  Doug, Are you okay with distal tattooing only (meaning below the polypectomy site)?  As opposed to proximal and distal margins.  Thanks  Zenovia Jarred    ----- Message ----- From: Ladell Pier, MD Sent: 09/30/2017   4:47 PM To: Coralie Keens, MD, Jerene Bears, MD, #  Invasive adenocarcinoma involving a polyp with a close margin for invasive carcinoma and adenoma at the margin  I think he should undergo repeat marking of the polypectomy site and partial colectomy if you are in  agreement  Will the polypectomy ulcer still be visible?  Please check a preoperative CEA  I will plan to see him after surgery  Dawn, please schedule him for the GI conference 10/08/2017  Thanks,  Leroy Sea

## 2017-10-02 NOTE — Telephone Encounter (Signed)
Patient has been scheduled for a flexible sigmoidoscopy at 1:00 pm on 10/22/17 (with permission of Derek Gin, RN, Midwife for [facility]). He has been scheduled for previsit on 10/14/17 at 830 am. Patient verbalizes understanding of times/dates/locations of these appointments.

## 2017-10-02 NOTE — ED Triage Notes (Signed)
Pt reports taking the garbage out and broken glass cut his leg. Pt has approximately 3 inch laceration to R posterior calf, small amount of bleeding, dressing applied in triage.

## 2017-10-03 DIAGNOSIS — C187 Malignant neoplasm of sigmoid colon: Secondary | ICD-10-CM

## 2017-10-03 MED ORDER — TETANUS-DIPHTH-ACELL PERTUSSIS 5-2.5-18.5 LF-MCG/0.5 IM SUSP
0.5000 mL | Freq: Once | INTRAMUSCULAR | Status: AC
  Administered 2017-10-03: 0.5 mL via INTRAMUSCULAR
  Filled 2017-10-03: qty 0.5

## 2017-10-03 MED ORDER — LIDOCAINE-EPINEPHRINE (PF) 2 %-1:200000 IJ SOLN: 10.0000 mL | Freq: Once | INTRAMUSCULAR | Status: DC

## 2017-10-03 NOTE — ED Provider Notes (Signed)
[facility] EMERGENCY DEPARTMENT Provider Note   CSN: [id number] Arrival date & time: 10/02/17  2257     History   Chief Complaint Chief Complaint  Patient presents with  . Laceration    HPI KORY RAINS is a 52 y.o. male.  52 year old male presents for laceration to his right posterior cast.  This was sustained while he was taking out the trash and was cut by a piece of broken glass protruding from the bag.  Last tetanus was 7 years ago.  No medications taken prior to arrival.  He is not on chronic anticoagulation.  The history is provided by the patient. No language interpreter was used.  Laceration   The incident occurred 1 to 2 hours ago. The laceration is located on the right leg. The laceration is 5 cm in size. The laceration mechanism was a broken glass. The pain is mild. The pain has been intermittent since onset. He reports no foreign bodies present. His tetanus status is out of date (last updated 7 years ago).    Past Medical History:  Diagnosis Date  . Anxiety   . Cancer ([facility])   . Colon cancer ([facility])   . GERD (gastroesophageal reflux disease)   . HLD (hyperlipidemia)   . Hypertension   . Sleep apnea    Does not use cpap    Patient Active Problem List   Diagnosis Date Noted  . HTN (hypertension) 12/13/2011  . Hyperlipidemia 12/13/2011    Past Surgical History:  Procedure Laterality Date  . ANKLE SURGERY     right  . LAPAROSCOPY N/A 08/05/2017   Procedure: LAPAROSCOPY DIAGNOSTIC;  Surgeon: Coralie Keens, MD;  Location: [facility];  Service: General;  Laterality: N/A;  . PROCTOSCOPY N/A 08/05/2017   Procedure: RIGID PROCTOSCOPY;  Surgeon: Coralie Keens, MD;  Location: [facility];  Service: General;  Laterality: N/A;        Home Medications    Prior to Admission medications   Medication Sig Start Date End Date Taking? Authorizing Provider  allopurinol (ZYLOPRIM) 100 MG tablet Take 100 mg by mouth every evening.    [provider]  ALPRAZolam (NIRAVAM) 0.25 MG dissolvable tablet Take 0.25 mg by mouth 2 (two) times daily as needed for anxiety. 07/07/17   [provider]  aspirin 81 MG tablet Take 81 mg by mouth every evening.     [provider]  citalopram (CELEXA) 40 MG tablet Take 40 mg by mouth every evening.     [provider]  fish oil-omega-3 fatty acids 1000 MG capsule Take 1 g by mouth every evening.     [provider]  hydrOXYzine (VISTARIL) 25 MG capsule Take 25 mg by mouth at bedtime as needed for anxiety. 07/15/17   [provider]  lisinopril-hydrochlorothiazide (PRINZIDE,ZESTORETIC) 20-25 MG per tablet Take 1 tablet by mouth daily. Patient taking differently: Take 1 tablet by mouth every evening.  02/05/12   Nahser, Wonda Cheng, MD  Multiple Vitamins-Minerals (MULTIVITAMINS THER. W/MINERALS) TABS Take 1 tablet by mouth every evening.     [provider]  Nebivolol HCl 20 MG TABS Take 1 tablet (20 mg total) by mouth daily. Patient taking differently: Take 20 mg by mouth every evening.  02/05/12   Nahser, Wonda Cheng, MD  omeprazole (PRILOSEC) 20 MG capsule Take 20 mg by mouth every evening.     [provider]  oxyCODONE (OXY IR/ROXICODONE) 5 MG immediate release tablet Take 1-2 tablets (5-10 mg total) by  mouth every 6 (six) hours as needed for moderate pain, severe pain or breakthrough pain. 08/05/17   Coralie Keens, MD  potassium chloride (K-DUR) 10 MEQ tablet Take 1 tablet (10 mEq total) by mouth daily. Patient taking differently: Take 10 mEq by mouth every evening.  02/24/12   Nahser, Wonda Cheng, MD  simvastatin (ZOCOR) 40 MG tablet Take 1 tablet (40 mg total) by mouth at bedtime. 02/05/12   Nahser, Wonda Cheng, MD  testosterone cypionate (DEPO-TESTOSTERONE) 200 MG/ML injection Inject 300 mg into the muscle every 21 ( twenty-one) days.    [provider]    Family History Family History  Problem Relation Age of Onset  . Coronary artery  disease Unknown   . Hypertension Unknown     Social History Social History   Tobacco Use  . Smoking status: Never Smoker  . Smokeless tobacco: Never Used  Substance Use Topics  . Alcohol use: Yes    Comment: occasional  . Drug use: Not on file     Allergies   Patient has no known allergies.   Review of Systems Review of Systems Ten systems reviewed and are negative for acute change, except as noted in the HPI.    Physical Exam Updated Vital Signs BP (!) 109/56   Pulse 77   Temp 98 F (36.7 C) (Oral)   Resp 14   Ht 6\' 3"  (1.905 m)   Wt 111.6 kg (246 lb)   SpO2 91%   BMI 30.75 kg/m   Physical Exam  Constitutional: He is oriented to person, place, and time. He appears well-developed and well-nourished. No distress.  Nontoxic appearing and in NAD  HENT:  Head: Normocephalic and atraumatic.  Eyes: Conjunctivae and EOM are normal. No scleral icterus.  Neck: Normal range of motion.  Cardiovascular: Normal rate, regular rhythm and intact distal pulses.  Pulmonary/Chest: Effort normal. No respiratory distress.  Respirations even and unlabored  Musculoskeletal: Normal range of motion.       Right lower leg: He exhibits laceration.       Legs: Neurological: He is alert and oriented to person, place, and time. He exhibits normal muscle tone. Coordination normal.  Skin: Skin is warm and dry. No rash noted. He is not diaphoretic. No erythema. No pallor.  Psychiatric: He has a normal mood and affect. His behavior is normal.  Nursing note and vitals reviewed.    ED Treatments / Results  Labs (all labs ordered are listed, but only abnormal results are displayed) Labs Reviewed - No data to display  EKG None  Radiology No results found.  Procedures Procedures (including critical care time)  LACERATION REPAIR Performed by: Antonietta Breach Authorized by: Antonietta Breach Consent: Verbal consent obtained. Risks and benefits: risks, benefits and alternatives were  discussed Consent given by: patient Patient identity confirmed: provided demographic data Prepped and Draped in normal sterile fashion Wound explored  Laceration Location: posterior right calf  Laceration Length: 5cm; 3cm of which is superificial, abraded  No Foreign Bodies seen or palpated  Irrigation method: syringe Amount of cleaning: standard  Skin closure: steri-strips and dermabond  Technique: simple  Patient tolerance: Patient tolerated the procedure well with no immediate complications.   Medications Ordered in ED Medications  Tdap (BOOSTRIX) injection 0.5 mL (has no administration in time range)     Initial Impression / Assessment and Plan / ED Course  I have reviewed the triage vital signs and the nursing notes.  Pertinent labs & imaging results that were available  during my care of the patient were reviewed by me and considered in my medical decision making (see chart for details).     Tdap booster given. Laceration occurred < 8 hours prior to repair which was well tolerated. Pt has no comorbidities to effect normal wound healing. Discussed home care with pt and answered questions. Pt to follow up for wound check as needed. Pt is hemodynamically stable with no complaints prior to discharge.   Final Clinical Impressions(s) / ED Diagnoses   Final diagnoses:  Laceration of calf, right, initial encounter    ED Discharge Orders    None       Antonietta Breach, PA-C 10/03/17 0046    Ripley Fraise, MD 10/03/17 [phone]

## 2017-10-03 NOTE — Discharge Instructions (Signed)
Keep the area wrapped for the next 2-3 days. Avoid soaking your cut in stagnant water. You may shower normally. Use tylenol or ibuprofen for pain.

## 2017-10-07 ENCOUNTER — Encounter: Payer: Self-pay | Admitting: Licensed Clinical Social Worker

## 2017-10-14 ENCOUNTER — Ambulatory Visit (AMBULATORY_SURGERY_CENTER): Payer: Self-pay

## 2017-10-14 VITALS — Ht 75.0 in | Wt 244.4 lb

## 2017-10-14 DIAGNOSIS — Z8371 Family history of colonic polyps: Secondary | ICD-10-CM

## 2017-10-14 DIAGNOSIS — Z85038 Personal history of other malignant neoplasm of large intestine: Secondary | ICD-10-CM

## 2017-10-14 MED ORDER — NA SULFATE-K SULFATE-MG SULF 17.5-3.13-1.6 GM/177ML PO SOLN: 1.0000 | Freq: Once | ORAL | 0 refills | Status: AC

## 2017-10-14 NOTE — Progress Notes (Signed)
Per pt, no allergies to soy or egg products.Pt not taking any weight loss meds or using  O2 at home.  Pt refused emmi video. 

## 2017-10-22 ENCOUNTER — Ambulatory Visit (AMBULATORY_SURGERY_CENTER): Payer: 59 | Admitting: Internal Medicine

## 2017-10-22 ENCOUNTER — Encounter: Payer: Self-pay | Admitting: Internal Medicine

## 2017-10-22 VITALS — BP 134/88 | HR 66 | Temp 98.7°F | Resp 14 | Ht 75.0 in | Wt 244.0 lb

## 2017-10-22 DIAGNOSIS — K3189 Other diseases of stomach and duodenum: Secondary | ICD-10-CM

## 2017-10-22 DIAGNOSIS — Z85038 Personal history of other malignant neoplasm of large intestine: Secondary | ICD-10-CM

## 2017-10-22 DIAGNOSIS — K5140 Inflammatory polyps of colon without complications: Secondary | ICD-10-CM

## 2017-10-22 DIAGNOSIS — D125 Benign neoplasm of sigmoid colon: Secondary | ICD-10-CM

## 2017-10-22 MED ORDER — SODIUM CHLORIDE 0.9 % IV SOLN: 500.0000 mL | Freq: Once | INTRAVENOUS | Status: DC

## 2017-10-22 NOTE — Progress Notes (Signed)
Per Dr. Zenovia Jarred okay to send regular pathology. maw

## 2017-10-22 NOTE — Progress Notes (Signed)
Called to room to assist during endoscopic procedure.  Patient ID and intended procedure confirmed with present staff. Received instructions for my participation in the procedure from the performing physician.  

## 2017-10-22 NOTE — Progress Notes (Signed)
Report to PACU, RN, vss, BBS= Clear.  

## 2017-10-22 NOTE — Patient Instructions (Signed)
Thank you for allowing [place] to care for you today!  Await pathology results 1-2 weeks.     YOU HAD AN ENDOSCOPIC PROCEDURE TODAY AT [facility]:   Refer to the procedure report that was given to you for any specific questions about what was found during the examination.  If the procedure report does not answer your questions, please call your gastroenterologist to clarify.  If you requested that your care partner not be given the details of your procedure findings, then the procedure report has been included in a sealed envelope for you to review at your convenience later.  YOU SHOULD EXPECT: Some feelings of bloating in the abdomen. Passage of more gas than usual.  Walking can help get rid of the air that was put into your GI tract during the procedure and reduce the bloating. If you had a lower endoscopy (such as a colonoscopy or flexible sigmoidoscopy) you may notice spotting of blood in your stool or on the toilet paper. If you underwent a bowel prep for your procedure, you may not have a normal bowel movement for a few days.  Please Note:  You might notice some irritation and congestion in your nose or some drainage.  This is from the oxygen used during your procedure.  There is no need for concern and it should clear up in a day or so.  SYMPTOMS TO REPORT IMMEDIATELY:   Following lower endoscopy (colonoscopy or flexible sigmoidoscopy):  Excessive amounts of blood in the stool  Significant tenderness or worsening of abdominal pains  Swelling of the abdomen that is new, acute  Fever of 100F or higher   For urgent or emergent issues, a gastroenterologist can be reached at any hour by calling [phone].   DIET:  We do recommend a small meal at first, but then you may proceed to your regular diet.  Drink plenty of fluids but you should avoid alcoholic beverages for 24 hours.  ACTIVITY:  You should plan to take it easy for the rest of today and you should NOT DRIVE or  use heavy machinery until tomorrow (because of the sedation medicines used during the test).    FOLLOW UP: Our staff will call the number listed on your records the next business day following your procedure to check on you and address any questions or concerns that you may have regarding the information given to you following your procedure. If we do not reach you, we will leave a message.  However, if you are feeling well and you are not experiencing any problems, there is no need to return our call.  We will assume that you have returned to your regular daily activities without incident.  If any biopsies were taken you will be contacted by phone or by letter within the next 1-3 weeks.  Please call us at [phone] if you have not heard about the biopsies in 3 weeks.    SIGNATURES/CONFIDENTIALITY: You and/or your care partner have signed paperwork which will be entered into your electronic medical record.  These signatures attest to the fact that that the information above on your After Visit Summary has been reviewed and is understood.  Full responsibility of the confidentiality of this discharge information lies with you and/or your care-partner.

## 2017-10-22 NOTE — Progress Notes (Signed)
Pt's states no medical or surgical changes since previsit or office visit. 

## 2017-10-22 NOTE — Op Note (Signed)
[facility] Patient Name: Derek [name] Procedure Date: 10/22/2017 1:07 PM MRN: [id number] Endoscopist: Jerene Bears , MD Age: 52 Referring MD:  Date of Birth: 06-02-1965 Gender: Male Account #: [id number] Procedure:                Flexible Sigmoidoscopy Indications:              Personal history of malignant neoplasm of the colon Medicines:                Monitored Anesthesia Care Procedure:                Pre-Anesthesia Assessment:                           - Prior to the procedure, a History and Physical                            was performed, and patient medications and                            allergies were reviewed. The patient's tolerance of                            previous anesthesia was also reviewed. The risks                            and benefits of the procedure and the sedation                            options and risks were discussed with the patient.                            All questions were answered, and informed consent                            was obtained. Prior Anticoagulants: The patient has                            taken no previous anticoagulant or antiplatelet                            agents. ASA Grade Assessment: II - A patient with                            mild systemic disease. After reviewing the risks                            and benefits, the patient was deemed in                            satisfactory condition to undergo the procedure.                           After obtaining informed consent, the scope was  passed under direct vision. The Colonoscope was                            introduced through the anus and advanced to the                            splenic flexure. The flexible sigmoidoscopy was                            accomplished without difficulty. The patient                            tolerated the procedure well. The quality of the                            bowel preparation  was excellent. Scope In: Scope Out: Findings:                 A tattoo was seen in the distal sigmoid colon in 2                            locations. A scar was found at the tattoo site,                            presumed polypectomy site (now know to have been                            adenocarcinoma). This scar was biopsied with a cold                            forceps for histology.                           A 4 mm polyp was found in the distal sigmoid colon.                            The polyp was sessile and just distal to the                            previously placed tattoo. The polyp was removed                            with a cold snare. Resection and retrieval were                            complete.                           An area in the distal sigmoid colon (distal to                            scar) was tattooed with 5 separate injections Spot                            (  carbon black) in circumferential fashion (6.5 mL                            total).                           Internal hemorrhoids were found during                            retroflexion. The hemorrhoids were small. Complications:            No immediate complications. Estimated Blood Loss:     Estimated blood loss was minimal. Impression:               - A tattoo was seen in the distal sigmoid colon. A                            scar was found at the tattoo site. Biopsied.                           - One 4 mm polyp in the distal sigmoid colon,                            removed with a cold snare. Resected and retrieved.                           - Internal hemorrhoids.                           - Additional areas in the distal sigmoid colon                            (distal to scar) was tattooed. Recommendation:           - Patient has a contact number available for                            emergencies. The signs and symptoms of potential                            delayed complications  were discussed with the                            patient. Return to normal activities tomorrow.                            Written discharge instructions were provided to the                            patient.                           - Resume previous diet.                           - Await pathology results. Jerene Bears, MD 10/22/2017 1:42:51  PM This report has been signed electronically.

## 2017-10-23 ENCOUNTER — Telehealth: Payer: Self-pay | Admitting: *Deleted

## 2017-10-23 NOTE — Telephone Encounter (Signed)
  Follow up Call-  Call back number 10/22/2017  Post procedure Call Back phone  # [phone]  Permission to leave phone message Yes  Some recent data might be hidden     Patient questions:  Do you have a fever, pain , or abdominal swelling? No. Pain Score  0 *  Have you tolerated food without any problems? Yes.    Have you been able to return to your normal activities? Yes.    Do you have any questions about your discharge instructions: Diet   No. Medications  No. Follow up visit  No.  Do you have questions or concerns about your Care? No.  Actions: * If pain score is 4 or above: No action needed, pain <4.

## 2017-11-05 ENCOUNTER — Telehealth: Payer: Self-pay

## 2017-11-05 NOTE — Telephone Encounter (Signed)
Called pt PCP Dr. Leanna Battles to have records faxed over. Also LM for pt to bring any records they may have to appointment.

## 2017-11-21 ENCOUNTER — Inpatient Hospital Stay: Payer: 59 | Attending: Oncology | Admitting: Oncology

## 2017-11-21 VITALS — BP 136/85 | HR 75 | Temp 98.1°F | Resp 18 | Ht 75.0 in | Wt 248.7 lb

## 2017-11-21 DIAGNOSIS — E785 Hyperlipidemia, unspecified: Secondary | ICD-10-CM | POA: Diagnosis not present

## 2017-11-21 DIAGNOSIS — Z79899 Other long term (current) drug therapy: Secondary | ICD-10-CM

## 2017-11-21 DIAGNOSIS — F419 Anxiety disorder, unspecified: Secondary | ICD-10-CM | POA: Insufficient documentation

## 2017-11-21 DIAGNOSIS — E291 Testicular hypofunction: Secondary | ICD-10-CM | POA: Diagnosis not present

## 2017-11-21 DIAGNOSIS — E119 Type 2 diabetes mellitus without complications: Secondary | ICD-10-CM | POA: Diagnosis not present

## 2017-11-21 DIAGNOSIS — M109 Gout, unspecified: Secondary | ICD-10-CM | POA: Insufficient documentation

## 2017-11-21 DIAGNOSIS — C187 Malignant neoplasm of sigmoid colon: Secondary | ICD-10-CM | POA: Insufficient documentation

## 2017-11-21 DIAGNOSIS — I10 Essential (primary) hypertension: Secondary | ICD-10-CM | POA: Insufficient documentation

## 2017-11-21 NOTE — Progress Notes (Signed)
  [facility] OFFICE PROGRESS NOTE   Diagnosis: Colon cancer  INTERVAL HISTORY:   Derek [name] returns for a scheduled visit.  He underwent a repeat flexible sigmoidoscopy by Dr. Hilarie Fredrickson on 10/22/2017.  A tattoo was seen in 2 locations in the distal sigmoid colon.  A scar was noted at the tattoo site.  The scar was biopsied.  A 4 mm polyp was removed distal to the tattoo.  An area distal to the scar was tattooed in a circumferential fashion.  He feels well.  No complaint. Objective:  Vital signs in last 24 hours:  Blood pressure 136/85, pulse 75, temperature 98.1 F (36.7 C), temperature source Oral, resp. rate 18, height '6\' 3"'$  (1.905 m), weight 248 lb 11.2 oz (112.8 kg), SpO2 98 %.   Exam-not performed today   Medications: I have reviewed the patient's current medications.   Assessment/Plan: 1. Invasive well-differentiated adenocarcinoma involving a sigmoid polyp (tubular adenoma with high-grade dysplasia), no loss of mismatch repair protein expression ? Invasive adenocarcinoma less than 1 mm from the deep margin, no lymphovascular invasion, adenoma present at the lateral margin, tumor budding present status post a polypectomy 06/09/2017 ? No loss of mismatch repair protein expression ? Polypectomy site tattooed 06/24/2017 ? Staging CTs 06/30/2017- no primary tumor identified, no evidence of metastatic disease ? Laparoscopy/proctoscopy 08/05/2017- polypectomy site could not be identified, planned colon resection aborted ? Sigmoidoscopy 10/22/2017- scar at sigmoid tattoo site, biopsy revealed benign clonic mucosa with tattoo ink.  No high-grade dysplasia or malignancy.,  Hyperplastic polyp removed distal to the tattoo 2. Multiple colon polyps, tubular adenomas, identified on the colonoscopy 06/09/2017  3.   Hyperlipidemia  4.   Hypertension  5.   History of diabetes-currently maintained off of diabetes medications  6.  Gout  7.  Low testosterone  8.  History of  anxiety     Disposition: Derek [name] was diagnosed with adenocarcinoma involving a sigmoid colon polyp.  A repeat sigmoidoscopy with biopsy of the sigmoid scar revealed no evidence of residual/recurrent carcinoma.  He is considering whether to proceed with a segmental resection versus close observation.  His case was presented at the GI tumor conference.  The surgeons present felt observation is a reasonable option in his case.  I explained there is no guarantee he will not have a local or distant recurrence with either approach.  He would like to seek the opinion of a colorectal surgeon.  We made a referral today.  He plans to continue colonoscopy surveillance with Dr. Hilarie Fredrickson.  The CEA has not been checked.  I will recommend he return to check the CEA.  He is not scheduled for a follow-up appointment at the Cancer center.  I am available to see him in the future as needed.   Betsy Coder, MD  11/21/2017  12:33 PM

## 2017-11-21 NOTE — Progress Notes (Signed)
Pt requested appointment for second surgical opinion at [facility]. Called [facility] and appointment obtained for 12/02/17 @ 11:30 to arrive at 11:15.  Apt with Dr. Annye English. Called and left VM for pt. I also requested that patient call to confirm appointment with [facility].

## 2017-11-27 ENCOUNTER — Encounter

## 2017-11-27 ENCOUNTER — Ambulatory Visit: Payer: 59 | Admitting: Cardiovascular Disease

## 2017-12-02 ENCOUNTER — Ambulatory Visit: Payer: 59 | Admitting: Cardiovascular Disease

## 2017-12-02 ENCOUNTER — Encounter: Payer: Self-pay | Admitting: Cardiovascular Disease

## 2017-12-02 VITALS — BP 140/82 | HR 84 | Ht 75.0 in | Wt 251.0 lb

## 2017-12-02 DIAGNOSIS — E785 Hyperlipidemia, unspecified: Secondary | ICD-10-CM | POA: Diagnosis not present

## 2017-12-02 DIAGNOSIS — I10 Essential (primary) hypertension: Secondary | ICD-10-CM | POA: Diagnosis not present

## 2017-12-02 DIAGNOSIS — C187 Malignant neoplasm of sigmoid colon: Secondary | ICD-10-CM | POA: Diagnosis not present

## 2017-12-02 MED ORDER — LOSARTAN POTASSIUM 100 MG PO TABS: 100.0000 mg | ORAL_TABLET | Freq: Every day | ORAL | 3 refills | Status: DC

## 2017-12-02 NOTE — Progress Notes (Signed)
Cardiology Office Note:    Date:  12/02/2017   ID:  Derek [name], DOB 12-23-1965, MRN [id number]  PCP:  Leanna Battles, MD  Cardiologist:  Acie Fredrickson  Referring MD: Leanna Battles, MD   Problem list 1.  Hypertension 2.  Hyperlipidemia 3.  Colon cancer  Chief Complaint  Patient presents with  . Hypertension     Sept. 10, 2019   Derek [name] is a 52 y.o. male with a hx of hypertension and hyperlipidemia.  I last saw Derek [name] in September, 2013.  Since I last seen him he was diagnosed with well-differentiated adenocarcinoma of the colon ( in March, 2019)   [name] is seen after 5 years absence.   Is now in sales,  [employer].  His son is at [facility] . Daughter is at [employer].    His first colonoscopy  Shows well differentiated colon cancer  Is going to see Dr. Dema Severin at [facility] . Also sees [employer] .   Has some anxiety - especially with this diagnosis His father also had some additional stents placed this past year.   Still getting regular exercise.   Walks regularly  3-4 days a week.   No CP or dyspnea with that .    Bp has been mildly elevated. Is on Lisinopril - HCTZ 20-12.5 mg a day  ( med list is not correct today ) Bystolic 20 mg a day .      Past Medical History:  Diagnosis Date  . Anxiety   . Cancer ([place])   . Colon cancer ([place]) 05/2017   By Dr Paulita Fujita  . GERD (gastroesophageal reflux disease)   . Gout   . HLD (hyperlipidemia)   . Hypertension   . Sleep apnea    Does not use cpap    Past Surgical History:  Procedure Laterality Date  . ANKLE SURGERY     right  . COLONOSCOPY  05/2017   Had adenocarcinoma sigmoid polyp  . LAPAROSCOPY N/A 08/05/2017   Procedure: LAPAROSCOPY DIAGNOSTIC;  Surgeon: Coralie Keens, MD;  Location: [facility];  Service: General;  Laterality: N/A;  . PROCTOSCOPY N/A 08/05/2017   Procedure: RIGID PROCTOSCOPY;  Surgeon: Coralie Keens, MD;  Location: [facility];  Service: General;  Laterality: N/A;     Current Medications: Current Meds  Medication Sig  . allopurinol (ZYLOPRIM) 100 MG tablet Take 100 mg by mouth every evening.  Marland Kitchen ALPRAZolam (NIRAVAM) 0.25 MG dissolvable tablet Take 0.25 mg by mouth 2 (two) times daily as needed for anxiety.  Marland Kitchen aspirin 81 MG tablet Take 81 mg by mouth every evening.   . citalopram (CELEXA) 40 MG tablet Take 40 mg by mouth every evening.   . fish oil-omega-3 fatty acids 1000 MG capsule Take 1 g by mouth every evening.   . hydrOXYzine (VISTARIL) 25 MG capsule Take 25 mg by mouth at bedtime as needed for anxiety.  . Multiple Vitamins-Minerals (MULTIVITAMINS THER. W/MINERALS) TABS Take 1 tablet by mouth every evening.   . Nebivolol HCl 20 MG TABS Take 1 tablet (20 mg total) by mouth daily.  Marland Kitchen omeprazole (PRILOSEC) 20 MG capsule Take 20 mg by mouth every evening.   . potassium chloride (K-DUR) 10 MEQ tablet Take 1 tablet (10 mEq total) by mouth daily.  . simvastatin (ZOCOR) 40 MG tablet Take 1 tablet (40 mg total) by mouth at bedtime.  Marland Kitchen testosterone cypionate (DEPO-TESTOSTERONE) 200 MG/ML injection Inject 300 mg into the muscle every 21 (  twenty-one) days.  . [DISCONTINUED] lisinopril-hydrochlorothiazide (PRINZIDE,ZESTORETIC) 20-25 MG per tablet Take 1 tablet by mouth daily.     Allergies:   Cardizem [diltiazem hcl] and Toprol xl [metoprolol tartrate]   Social History   Socioeconomic History  . Marital status: Married    Spouse name: Not on file  . Number of children: Not on file  . Years of education: Not on file  . Highest education level: Not on file  Occupational History  . Not on file  Social Needs  . Financial resource strain: Not on file  . Food insecurity:    Worry: Not on file    Inability: Not on file  . Transportation needs:    Medical: Not on file    Non-medical: Not on file  Tobacco Use  . Smoking status: Never Smoker  . Smokeless tobacco: Never Used  Substance and Sexual Activity  . Alcohol use: Yes    Comment: occasional   . Drug use: Not Currently  . Sexual activity: Not on file  Lifestyle  . Physical activity:    Days per week: Not on file    Minutes per session: Not on file  . Stress: Not on file  Relationships  . Social connections:    Talks on phone: Not on file    Gets together: Not on file    Attends religious service: Not on file    Active member of club or organization: Not on file    Attends meetings of clubs or organizations: Not on file    Relationship status: Not on file  Other Topics Concern  . Not on file  Social History Narrative  . Not on file     Family History: The patient's family history includes Colon polyps in his sister; Coronary artery disease in his unknown relative; Heart disease in his father; Hypertension in his unknown relative; Lung cancer in his mother.  ROS:   Please see the history of present illness.     All other systems reviewed and are negative.  EKGs/Labs/Other Studies Reviewed:    The following studies were reviewed today:   Recent Labs: 07/31/2017: BUN 13; Creatinine, Ser 1.06; Hemoglobin 15.4; Platelets 188; Potassium 4.0; Sodium 137  Recent Lipid Panel No results found for: CHOL, TRIG, HDL, CHOLHDL, VLDL, LDLCALC, LDLDIRECT  Physical Exam:    VS:  BP 140/82   Pulse 84   Ht 6\' 3"  (1.905 m)   Wt 251 lb (113.9 kg)   SpO2 97%   BMI 31.37 kg/m     Wt Readings from Last 3 Encounters:  12/02/17 251 lb (113.9 kg)  11/21/17 248 lb 11.2 oz (112.8 kg)  10/22/17 244 lb (110.7 kg)     GEN:   Middle age, healthy man, NAD  HEENT: Normal NECK: No JVD; No carotid bruits LYMPHATICS: No lymphadenopathy CARDIAC: RRR, no murmurs, rubs, gallops RESPIRATORY:  Clear to auscultation without rales, wheezing or rhonchi  ABDOMEN: Soft, non-tender, non-distended MUSCULOSKELETAL:  No edema; No deformity  SKIN: Warm and dry NEUROLOGIC:  Alert and oriented x 3 PSYCHIATRIC:  Normal affect    ECG :   December 02, 2017: Normal sinus rhythm at 84.  Normal  EKG.   ASSESSMENT:    1. Essential hypertension   2. Hyperlipidemia, unspecified hyperlipidemia type    PLAN:    In order of problems listed above:  1. 1.  Hypertension: Laronn had minimally elevated blood pressure for the past several years.  We will discontinue the lisinopril HCT.  We will start him on losartan 100 mg a day and HCTZ 25 mg a day.  Advised him to stop to work on a diet, exercise, weight loss program.    2.  Hyperlipidemia: His triglyceride levels are very elevated. Needs to work on his diet. Will consider Vascepa 2 gm BID if his levels stay elevated.    Medication Adjustments/Labs and Tests Ordered: Current medicines are reviewed at length with the patient today.  Concerns regarding medicines are outlined above.  Orders Placed This Encounter  Procedures  . Basic Metabolic Panel (BMET)   Meds ordered this encounter  Medications  . losartan (COZAAR) 100 MG tablet    Sig: Take 1 tablet (100 mg total) by mouth daily.    Dispense:  90 tablet    Refill:  3    Patient Instructions  Medication Instructions:  Your physician has recommended you make the following change in your medication:   STOP Lisinopril/HCTZ START Losartan (Cozaar) 100 mg once daily START HCTZ (Hydrochlorothiazide) 25 mg once daily    Labwork: Your physician recommends that you return for lab work in: 3 weeks for basic metabolic panel   Testing/Procedures: None Ordered   Follow-Up: Your physician wants you to follow-up in: 6 months with Dr. Acie Fredrickson. You will receive a reminder letter in the mail two months in advance. If you don't receive a letter, please call our office to schedule the follow-up appointment.   If you need a refill on your cardiac medications before your next appointment, please call your pharmacy.   Thank you for choosing [facility]! Christen Bame, RN [phone]       Signed, Mertie Moores, MD  12/02/2017 4:54 PM    [place]

## 2017-12-02 NOTE — Patient Instructions (Signed)
Medication Instructions:  Your physician has recommended you make the following change in your medication:   STOP Lisinopril/HCTZ START Losartan (Cozaar) 100 mg once daily START HCTZ (Hydrochlorothiazide) 25 mg once daily    Labwork: Your physician recommends that you return for lab work in: 3 weeks for basic metabolic panel   Testing/Procedures: None Ordered   Follow-Up: Your physician wants you to follow-up in: 6 months with Dr. Acie Fredrickson. You will receive a reminder letter in the mail two months in advance. If you don't receive a letter, please call our office to schedule the follow-up appointment.   If you need a refill on your cardiac medications before your next appointment, please call your pharmacy.   Thank you for choosing [facility]! Christen Bame, RN [phone]

## 2017-12-03 NOTE — Addendum Note (Signed)
Addended by: De Burrs on: 12/03/2017 07:51 AM   Modules accepted: Orders

## 2017-12-11 ENCOUNTER — Other Ambulatory Visit: Payer: Self-pay | Admitting: *Deleted

## 2017-12-11 DIAGNOSIS — E291 Testicular hypofunction: Secondary | ICD-10-CM | POA: Diagnosis not present

## 2017-12-11 MED ORDER — HYDROCHLOROTHIAZIDE 25 MG PO TABS: 25.0000 mg | ORAL_TABLET | Freq: Every day | ORAL | 3 refills | Status: DC

## 2017-12-11 NOTE — Telephone Encounter (Signed)
Patient called and stated that he was seen by Dr Acie Fredrickson for an office visit last week and two rx's were supposed to be sent in but his pharmacy only received one. Per notes from his visit, 12/02/17:   Patient Instructions by Emmaline Life, RN at 12/02/2017 9:40 AM  Author: Emmaline Life, RN Author Type: Registered Nurse Filed: 12/02/2017 10:48 AM  Note Status: Signed Cosign: Cosign Not Required Encounter Date: 12/02/2017  Editor: Emmaline Life, RN (Registered Nurse)    Medication Instructions:  Your physician has recommended you make the following change in your medication:   STOP Lisinopril/HCTZ START Losartan (Cozaar) 100 mg once daily START HCTZ (Hydrochlorothiazide) 25 mg once daily      The losartan was sent in but not the hctz. I confirmed patients preferred pharmacy and will send this in for him as requested.

## 2017-12-12 ENCOUNTER — Inpatient Hospital Stay: Payer: 59 | Attending: Oncology

## 2017-12-12 DIAGNOSIS — C187 Malignant neoplasm of sigmoid colon: Secondary | ICD-10-CM | POA: Diagnosis not present

## 2017-12-12 LAB — CEA (IN HOUSE-CHCC): CEA (CHCC-In House): 1.71 ng/mL (ref 0.00–5.00)

## 2017-12-25 DIAGNOSIS — E291 Testicular hypofunction: Secondary | ICD-10-CM | POA: Diagnosis not present

## 2017-12-26 ENCOUNTER — Other Ambulatory Visit: Payer: 59

## 2018-01-07 ENCOUNTER — Telehealth: Payer: Self-pay | Admitting: *Deleted

## 2018-01-07 NOTE — Telephone Encounter (Signed)
TC from patient requesting results of recent CEA.  Gave pt results, which were WNL.  No further questions or concerns.

## 2018-01-19 ENCOUNTER — Other Ambulatory Visit: Payer: 59

## 2018-01-20 ENCOUNTER — Other Ambulatory Visit: Payer: 59

## 2018-01-20 DIAGNOSIS — Z23 Encounter for immunization: Secondary | ICD-10-CM | POA: Diagnosis not present

## 2018-01-20 DIAGNOSIS — E785 Hyperlipidemia, unspecified: Secondary | ICD-10-CM | POA: Diagnosis not present

## 2018-01-20 DIAGNOSIS — I10 Essential (primary) hypertension: Secondary | ICD-10-CM

## 2018-01-20 LAB — BASIC METABOLIC PANEL
BUN/Creatinine Ratio: 13 (ref 9–20)
BUN: 14 mg/dL (ref 6–24)
CO2: 22 mmol/L (ref 20–29)
CREATININE: 1.06 mg/dL (ref 0.76–1.27)
Calcium: 9.7 mg/dL (ref 8.7–10.2)
Chloride: 94 mmol/L — ABNORMAL LOW (ref 96–106)
GFR, EST AFRICAN AMERICAN: 94 mL/min/{1.73_m2} (ref >59)
GFR, EST NON AFRICAN AMERICAN: 81 mL/min/{1.73_m2} (ref >59)
Glucose: 121 mg/dL — ABNORMAL HIGH (ref 65–99)
Potassium: 4.6 mmol/L (ref 3.5–5.2)
SODIUM: 134 mmol/L (ref 134–144)

## 2018-01-22 ENCOUNTER — Other Ambulatory Visit: Payer: Self-pay | Admitting: *Deleted

## 2018-01-22 MED ORDER — HYDROCHLOROTHIAZIDE 25 MG PO TABS: 25.0000 mg | ORAL_TABLET | Freq: Every day | ORAL | 3 refills | Status: DC

## 2018-01-22 MED ORDER — LOSARTAN POTASSIUM 100 MG PO TABS: 100.0000 mg | ORAL_TABLET | Freq: Every day | ORAL | 3 refills | Status: DC

## 2018-02-17 DIAGNOSIS — E291 Testicular hypofunction: Secondary | ICD-10-CM | POA: Diagnosis not present

## 2018-03-12 DIAGNOSIS — E291 Testicular hypofunction: Secondary | ICD-10-CM | POA: Diagnosis not present

## 2018-03-31 DIAGNOSIS — E291 Testicular hypofunction: Secondary | ICD-10-CM | POA: Diagnosis not present

## 2018-04-03 ENCOUNTER — Other Ambulatory Visit: Payer: Self-pay | Admitting: Cardiovascular Disease

## 2018-04-03 MED ORDER — LOSARTAN POTASSIUM 100 MG PO TABS: 100.0000 mg | ORAL_TABLET | Freq: Every day | ORAL | 2 refills | Status: DC

## 2018-04-03 MED ORDER — HYDROCHLOROTHIAZIDE 25 MG PO TABS: 25.0000 mg | ORAL_TABLET | Freq: Every day | ORAL | 2 refills | Status: DC

## 2018-04-03 MED ORDER — POTASSIUM CHLORIDE ER 10 MEQ PO TBCR: 10.0000 meq | EXTENDED_RELEASE_TABLET | Freq: Every day | ORAL | 2 refills | Status: DC

## 2018-04-21 DIAGNOSIS — E291 Testicular hypofunction: Secondary | ICD-10-CM | POA: Diagnosis not present

## 2018-05-05 ENCOUNTER — Encounter: Payer: Self-pay | Admitting: *Deleted

## 2018-05-19 DIAGNOSIS — E291 Testicular hypofunction: Secondary | ICD-10-CM | POA: Diagnosis not present

## 2018-05-25 ENCOUNTER — Encounter: Payer: Self-pay | Admitting: Internal Medicine

## 2018-06-01 ENCOUNTER — Ambulatory Visit (AMBULATORY_SURGERY_CENTER): Payer: Self-pay | Admitting: *Deleted

## 2018-06-01 ENCOUNTER — Encounter: Payer: Self-pay | Admitting: Internal Medicine

## 2018-06-01 VITALS — Ht 75.0 in | Wt 249.0 lb

## 2018-06-01 DIAGNOSIS — Z85038 Personal history of other malignant neoplasm of large intestine: Secondary | ICD-10-CM

## 2018-06-01 MED ORDER — NA SULFATE-K SULFATE-MG SULF 17.5-3.13-1.6 GM/177ML PO SOLN: ORAL | 0 refills | Status: DC

## 2018-06-01 NOTE — Progress Notes (Signed)
Patient denies any allergies to eggs or soy. Patient denies any problems with anesthesia/sedation. Patient denies any oxygen use at home. Patient denies taking any diet/weight loss medications or blood thinners.Suprep $15 off coupon given to pt.

## 2018-06-11 ENCOUNTER — Encounter: Payer: 59 | Admitting: Internal Medicine

## 2018-06-17 DIAGNOSIS — E291 Testicular hypofunction: Secondary | ICD-10-CM | POA: Diagnosis not present

## 2018-07-13 DIAGNOSIS — R82998 Other abnormal findings in urine: Secondary | ICD-10-CM | POA: Diagnosis not present

## 2018-07-13 DIAGNOSIS — Z0000 Encounter for general adult medical examination without abnormal findings: Secondary | ICD-10-CM | POA: Diagnosis not present

## 2018-07-15 DIAGNOSIS — E291 Testicular hypofunction: Secondary | ICD-10-CM | POA: Diagnosis not present

## 2018-07-20 DIAGNOSIS — Z0000 Encounter for general adult medical examination without abnormal findings: Secondary | ICD-10-CM | POA: Diagnosis not present

## 2018-07-21 ENCOUNTER — Encounter: Payer: 59 | Admitting: Internal Medicine

## 2018-07-28 ENCOUNTER — Telehealth: Payer: Self-pay | Admitting: *Deleted

## 2018-07-28 DIAGNOSIS — Z85038 Personal history of other malignant neoplasm of large intestine: Secondary | ICD-10-CM

## 2018-07-28 MED ORDER — NA SULFATE-K SULFATE-MG SULF 17.5-3.13-1.6 GM/177ML PO SOLN: ORAL | 0 refills | Status: DC

## 2018-07-28 NOTE — Telephone Encounter (Signed)
Suprep sent into pt's pharmacy.  New instructions sent via MyChart

## 2018-07-28 NOTE — Telephone Encounter (Signed)
Called pt to reschedule postponed procedure.  Rescheduled for 5/19 @ 930.  Will resend prep RX to [facility] on [address] and send new prep instructions via My Chart.

## 2018-08-09 ENCOUNTER — Telehealth: Payer: Self-pay | Admitting: *Deleted

## 2018-08-09 NOTE — Telephone Encounter (Signed)
LMOM to that we will be calling tomorrow to go over covid screening questions

## 2018-08-10 ENCOUNTER — Telehealth: Payer: Self-pay | Admitting: *Deleted

## 2018-08-10 NOTE — Telephone Encounter (Signed)
Covid-19 travel screening questions  Have you traveled in the last 14 days?Yes If yes where? [place], [place] for work  Do you now or have you had a fever in the last 14 days?no  Do you have any respiratory symptoms of shortness of breath or cough now or in the last 14 days?no  Do you have a medical history of Congestive Heart Failure?  Do you have a medical history of lung disease?  Do you have any family members or close contacts with diagnosed or suspected Covid-19?no \ PT made aware of care partner policy and will bring a mask with him. SM

## 2018-08-10 NOTE — Telephone Encounter (Signed)
Covid-19 travel screening questions  Have you traveled in the last 14 days? If yes where?  Do you now or have you had a fever in the last 14 days?  Do you have any respiratory symptoms of shortness of breath or cough now or in the last 14 days?  Do you have a medical history of Congestive Heart Failure?  Do you have a medical history of lung disease?  Do you have any family members or close contacts with diagnosed or suspected Covid-19?       

## 2018-08-10 NOTE — Telephone Encounter (Signed)
Ok to proceed, this is not travel to a high risk area, nor does he have symptoms or known exposure

## 2018-08-10 NOTE — Telephone Encounter (Signed)
Ulice Dash, I would not consider this a "positive" screening and so I think it is safe to proceed using the usual PPE precautions we are now taking in [facility]>    Thanks

## 2018-08-11 ENCOUNTER — Ambulatory Visit (AMBULATORY_SURGERY_CENTER): Payer: 59 | Admitting: Internal Medicine

## 2018-08-11 ENCOUNTER — Encounter: Payer: Self-pay | Admitting: Internal Medicine

## 2018-08-11 ENCOUNTER — Other Ambulatory Visit: Payer: Self-pay

## 2018-08-11 VITALS — BP 130/81 | HR 63 | Temp 97.8°F | Resp 17 | Ht 75.0 in | Wt 251.0 lb

## 2018-08-11 DIAGNOSIS — K635 Polyp of colon: Secondary | ICD-10-CM | POA: Diagnosis not present

## 2018-08-11 DIAGNOSIS — D123 Benign neoplasm of transverse colon: Secondary | ICD-10-CM

## 2018-08-11 DIAGNOSIS — Z85038 Personal history of other malignant neoplasm of large intestine: Secondary | ICD-10-CM | POA: Diagnosis not present

## 2018-08-11 DIAGNOSIS — D125 Benign neoplasm of sigmoid colon: Secondary | ICD-10-CM

## 2018-08-11 DIAGNOSIS — Z1211 Encounter for screening for malignant neoplasm of colon: Secondary | ICD-10-CM | POA: Diagnosis not present

## 2018-08-11 DIAGNOSIS — D122 Benign neoplasm of ascending colon: Secondary | ICD-10-CM

## 2018-08-11 MED ORDER — SODIUM CHLORIDE 0.9 % IV SOLN: 500.0000 mL | Freq: Once | INTRAVENOUS | Status: DC

## 2018-08-11 NOTE — Op Note (Signed)
[facility] Patient Name: Derek [name] Procedure Date: 08/11/2018 9:29 AM MRN: [id number] Endoscopist: Jerene Bears , MD Age: 53 Referring MD:  Date of Birth: 1965/10/01 Gender: Male Account #: [id number] Procedure:                Colonoscopy Indications:              High risk colon cancer surveillance: Personal                            history of colon cancer arising in 8 mm sigmoid                            polyp with 1 mm margin, attempted laparoscopic                            sigmoidectomy aborted as mucosal tattoo was not                            visible; repeat surveillance with flexible                            sigmoidoscopy performed July 2019 with additional                            biopsies at scar (negative for adenoma and                            malignancy), additional mucosal tattooing at that                            time. Medicines:                Monitored Anesthesia Care Procedure:                Pre-Anesthesia Assessment:                           - Prior to the procedure, a History and Physical                            was performed, and patient medications and                            allergies were reviewed. The patient's tolerance of                            previous anesthesia was also reviewed. The risks                            and benefits of the procedure and the sedation                            options and risks were discussed with the patient.  All questions were answered, and informed consent                            was obtained. Prior Anticoagulants: The patient has                            taken no previous anticoagulant or antiplatelet                            agents. ASA Grade Assessment: II - A patient with                            mild systemic disease. After reviewing the risks                            and benefits, the patient was deemed in   satisfactory condition to undergo the procedure.                           After obtaining informed consent, the colonoscope                            was passed under direct vision. Throughout the                            procedure, the patient's blood pressure, pulse, and                            oxygen saturations were monitored continuously. The                            Colonoscope was introduced through the anus and                            advanced to the cecum, identified by appendiceal                            orifice and ileocecal valve. The colonoscopy was                            performed without difficulty. The patient tolerated                            the procedure well. The quality of the bowel                            preparation was good. The ileocecal valve,                            appendiceal orifice, and rectum were photographed. Scope In: 9:45:22 AM Scope Out: 10:04:33 AM Scope Withdrawal Time: 0 hours 16 minutes 27 seconds  Total Procedure Duration: 0 hours 19 minutes 11 seconds  Findings:  The digital rectal exam was normal.                           Two sessile polyps were found in the ascending                            colon. The polyps were 1 to 2 mm in size. These                            polyps were removed with a cold snare. Resection                            and retrieval were complete.                           A 4 mm polyp was found in the transverse colon. The                            polyp was sessile. The polyp was removed with a                            cold snare. Resection and retrieval were complete.                           A tattoo was seen in the distal sigmoid colon. A                            post-polypectomy scar was found at the tattoo site.                            There was no evidence of residual polyp tissue.                           A 3 mm polyp was found in the distal sigmoid colon.                             The polyp was sessile and located in the area of                            tattooing (distal to the post-polypectomy scar                            described above). The polyp was removed with a cold                            snare. Resection and retrieval were complete.                           Internal hemorrhoids were found during                            retroflexion. The hemorrhoids were small.  No additional abnormalities were found on                            retroflexion. Complications:            No immediate complications. Estimated Blood Loss:     Estimated blood loss was minimal. Impression:               - Two 1 to 2 mm polyps in the ascending colon,                            removed with a cold snare. Resected and retrieved.                           - One 4 mm polyp in the transverse colon, removed                            with a cold snare. Resected and retrieved.                           - A tattoo was seen in the distal sigmoid colon. A                            post-polypectomy scar was found at the tattoo site.                            There was no evidence of residual polyp tissue.                           - One 3 mm polyp in the distal sigmoid colon,                            removed with a cold snare. Resected and retrieved.                           - Internal hemorrhoids. Recommendation:           - Patient has a contact number available for                            emergencies. The signs and symptoms of potential                            delayed complications were discussed with the                            patient. Return to normal activities tomorrow.                            Written discharge instructions were provided to the                            patient.                           -  Resume previous diet.                           - Continue present medications.                            - Await pathology results.                           - Repeat colonoscopy is recommended for                            surveillance. The colonoscopy date will be                            determined after pathology results from today's                            exam become available for review. Jerene Bears, MD 08/11/2018 10:16:57 AM This report has been signed electronically. CC Letter to:             Coralie Keens, MD

## 2018-08-11 NOTE — Progress Notes (Signed)
Pt's states no medical or surgical changes since previsit or office visit. Riki Sheer LPN took Temp and vital signs.

## 2018-08-11 NOTE — Progress Notes (Signed)
Called to room to assist during endoscopic procedure.  Patient ID and intended procedure confirmed with present staff. Received instructions for my participation in the procedure from the performing physician.  

## 2018-08-11 NOTE — Patient Instructions (Signed)
Information on polyps and hemorrhoids given to you today.  Await pathology results.  YOU HAD AN ENDOSCOPIC PROCEDURE TODAY AT [facility]:   Refer to the procedure report that was given to you for any specific questions about what was found during the examination.  If the procedure report does not answer your questions, please call your gastroenterologist to clarify.  If you requested that your care partner not be given the details of your procedure findings, then the procedure report has been included in a sealed envelope for you to review at your convenience later.  YOU SHOULD EXPECT: Some feelings of bloating in the abdomen. Passage of more gas than usual.  Walking can help get rid of the air that was put into your GI tract during the procedure and reduce the bloating. If you had a lower endoscopy (such as a colonoscopy or flexible sigmoidoscopy) you may notice spotting of blood in your stool or on the toilet paper. If you underwent a bowel prep for your procedure, you may not have a normal bowel movement for a few days.  Please Note:  You might notice some irritation and congestion in your nose or some drainage.  This is from the oxygen used during your procedure.  There is no need for concern and it should clear up in a day or so.  SYMPTOMS TO REPORT IMMEDIATELY:   Following lower endoscopy (colonoscopy or flexible sigmoidoscopy):  Excessive amounts of blood in the stool  Significant tenderness or worsening of abdominal pains  Swelling of the abdomen that is new, acute  Fever of 100F or higher   For urgent or emergent issues, a gastroenterologist can be reached at any hour by calling [phone].   DIET:  We do recommend a small meal at first, but then you may proceed to your regular diet.  Drink plenty of fluids but you should avoid alcoholic beverages for 24 hours.  ACTIVITY:  You should plan to take it easy for the rest of today and you should NOT DRIVE or use  heavy machinery until tomorrow (because of the sedation medicines used during the test).    FOLLOW UP: Our staff will call the number listed on your records 48-72 hours following your procedure to check on you and address any questions or concerns that you may have regarding the information given to you following your procedure. If we do not reach you, we will leave a message.  We will attempt to reach you two times.  During this call, we will ask if you have developed any symptoms of COVID 19. If you develop any symptoms (for example fever, flu-like symptoms, shortness of breath, cough etc.) before then, please call [phone].  If any biopsies were taken you will be contacted by phone or by letter within the next 1-3 weeks.  Please call us at [phone] if you have not heard about the biopsies in 3 weeks.    SIGNATURES/CONFIDENTIALITY: You and/or your care partner have signed paperwork which will be entered into your electronic medical record.  These signatures attest to the fact that that the information above on your After Visit Summary has been reviewed and is understood.  Full responsibility of the confidentiality of this discharge information lies with you and/or your care-partner.

## 2018-08-11 NOTE — Progress Notes (Signed)
A/ox3, pleased with MAC, report to RN 

## 2018-08-13 ENCOUNTER — Telehealth: Payer: Self-pay | Admitting: *Deleted

## 2018-08-13 NOTE — Telephone Encounter (Signed)
1. Have you developed a fever since your procedure? no  2.   Have you had an respiratory symptoms (SOB or cough) since your procedure? no  3.   Have you tested positive for COVID 19 since your procedure no  3.   Have you had any family members/close contacts diagnosed with the COVID 19 since your procedure?  no   If any of these questions are a yes, please inquire if patient has been seen by family doctor and route this note to Joylene Isacc, Therapist, sports.    Follow up Call-  Call back number 08/11/2018 10/22/2017  Post procedure Call Back phone  # [phone]  Permission to leave phone message Yes Yes  Some recent data might be hidden     Patient questions:  Do you have a fever, pain , or abdominal swelling? No. Pain Score  0 *  Have you tolerated food without any problems? Yes.    Have you been able to return to your normal activities? Yes.    Do you have any questions about your discharge instructions: Diet   No. Medications  No. Follow up visit  No.  Do you have questions or concerns about your Care? No.  Actions: * If pain score is 4 or above: No action needed, pain <4.

## 2018-08-18 ENCOUNTER — Encounter: Payer: Self-pay | Admitting: Internal Medicine

## 2018-11-23 ENCOUNTER — Telehealth: Payer: Self-pay | Admitting: Nurse Practitioner

## 2018-11-23 NOTE — Telephone Encounter (Signed)
Left detailed message for patient to call back regarding his appointment tomorrow with Dr. Acie Fredrickson

## 2018-11-24 ENCOUNTER — Telehealth (INDEPENDENT_AMBULATORY_CARE_PROVIDER_SITE_OTHER): Payer: 59 | Admitting: Cardiovascular Disease

## 2018-11-24 ENCOUNTER — Encounter: Payer: Self-pay | Admitting: Cardiovascular Disease

## 2018-11-24 ENCOUNTER — Other Ambulatory Visit: Payer: Self-pay

## 2018-11-24 VITALS — BP 130/80 | HR 76 | Ht 75.0 in | Wt 240.0 lb

## 2018-11-24 DIAGNOSIS — I10 Essential (primary) hypertension: Secondary | ICD-10-CM

## 2018-11-24 DIAGNOSIS — E782 Mixed hyperlipidemia: Secondary | ICD-10-CM | POA: Diagnosis not present

## 2018-11-24 NOTE — Telephone Encounter (Signed)
Pt called back earlier today and consented to telephone visit.     Virtual Visit Pre-Appointment Phone Call  "(Name), I am calling you today to discuss your upcoming appointment. We are currently trying to limit exposure to the virus that causes COVID-19 by seeing patients at home rather than in the office."  1. "What is the BEST phone number to call the day of the visit?" - include this in appointment notes  2. "Do you have or have access to (through a family member/friend) a smartphone with video capability that we can use for your visit?" a. If yes - list this number in appt notes as "cell" (if different from BEST phone #) and list the appointment type as a VIDEO visit in appointment notes b. If no - list the appointment type as a PHONE visit in appointment notes  3. Confirm consent - "In the setting of the current Covid19 crisis, you are scheduled for a (phone or video) visit with your provider on (date) at (time).  Just as we do with many in-office visits, in order for you to participate in this visit, we must obtain consent.  If you'd like, I can send this to your mychart (if signed up) or email for you to review.  Otherwise, I can obtain your verbal consent now.  All virtual visits are billed to your insurance company just like a normal visit would be.  By agreeing to a virtual visit, we'd like you to understand that the technology does not allow for your provider to perform an examination, and thus may limit your provider's ability to fully assess your condition. If your provider identifies any concerns that need to be evaluated in person, we will make arrangements to do so.  Finally, though the technology is pretty good, we cannot assure that it will always work on either your or our end, and in the setting of a video visit, we may have to convert it to a phone-only visit.  In either situation, we cannot ensure that we have a secure connection.  Are you willing to proceed?" STAFF: Did the  patient verbally acknowledge consent to telehealth visit? Document YES/NO here: YES  4. Advise patient to be prepared - "Two hours prior to your appointment, go ahead and check your blood pressure, pulse, oxygen saturation, and your weight (if you have the equipment to check those) and write them all down. When your visit starts, your provider will ask you for this information. If you have an Apple Watch or Kardia device, please plan to have heart rate information ready on the day of your appointment. Please have a pen and paper handy nearby the day of the visit as well."  5. Give patient instructions for MyChart download to smartphone OR Doximity/Doxy.me as below if video visit (depending on what platform provider is using)  6. Inform patient they will receive a phone call 15 minutes prior to their appointment time (may be from unknown caller ID) so they should be prepared to answer    TELEPHONE CALL NOTE  Derek [name] has been deemed a candidate for a follow-up tele-health visit to limit community exposure during the Covid-19 pandemic. I spoke with the patient via phone to ensure availability of phone/video source, confirm preferred email & phone number, and discuss instructions and expectations.  I reminded Derek [name] to be prepared with any vital sign and/or heart rhythm information that could potentially be obtained via home monitoring, at the time of his visit.  I reminded Derek [name] to expect a phone call prior to his visit.  Loren Racer, RN 11/24/2018 12:13 PM   INSTRUCTIONS FOR DOWNLOADING THE MYCHART APP TO SMARTPHONE  - The patient must first make sure to have activated MyChart and know their login information - If Apple, go to [employer] and type in MyChart in the search bar and download the app. If Android, ask patient to go to [employer] and type in [place] in the search bar and download the app. The app is free but as with any other app downloads, their phone may  require them to verify saved payment information or Apple/Android password.  - The patient will need to then log into the app with their MyChart username and password, and select [facility] as their healthcare provider to link the account. When it is time for your visit, go to the MyChart app, find appointments, and click Begin Video Visit. Be sure to Select Allow for your device to access the Microphone and Camera for your visit. You will then be connected, and your provider will be with you shortly.  **If they have any issues connecting, or need assistance please contact MyChart service desk [phone])**  **If using a computer, in order to ensure the best quality for their visit they will need to use either of the following Internet Browsers: [employer], or Google Chrome**  IF USING DOXIMITY or DOXY.ME - The patient will receive a link just prior to their visit by text.     FULL LENGTH CONSENT FOR TELE-HEALTH VISIT   I hereby voluntarily request, consent and authorize [facility] and its employed or contracted physicians, physician assistants, nurse practitioners or other licensed health care professionals (the Practitioner), to provide me with telemedicine health care services (the "Services") as deemed necessary by the treating Practitioner. I acknowledge and consent to receive the Services by the Practitioner via telemedicine. I understand that the telemedicine visit will involve communicating with the Practitioner through live audiovisual communication technology and the disclosure of certain medical information by electronic transmission. I acknowledge that I have been given the opportunity to request an in-person assessment or other available alternative prior to the telemedicine visit and am voluntarily participating in the telemedicine visit.  I understand that I have the right to withhold or withdraw my consent to the use of telemedicine in the course of my care at  any time, without affecting my right to future care or treatment, and that the Practitioner or I may terminate the telemedicine visit at any time. I understand that I have the right to inspect all information obtained and/or recorded in the course of the telemedicine visit and may receive copies of available information for a reasonable fee.  I understand that some of the potential risks of receiving the Services via telemedicine include:  Marland Kitchen Delay or interruption in medical evaluation due to technological equipment failure or disruption; . Information transmitted may not be sufficient (e.g. poor resolution of images) to allow for appropriate medical decision making by the Practitioner; and/or  . In rare instances, security protocols could fail, causing a breach of personal health information.  Furthermore, I acknowledge that it is my responsibility to provide information about my medical history, conditions and care that is complete and accurate to the best of my ability. I acknowledge that Practitioner's advice, recommendations, and/or decision may be based on factors not within their control, such as incomplete or inaccurate data provided by me or  distortions of diagnostic images or specimens that may result from electronic transmissions. I understand that the practice of medicine is not an exact science and that Practitioner makes no warranties or guarantees regarding treatment outcomes. I acknowledge that I will receive a copy of this consent concurrently upon execution via email to the email address I last provided but may also request a printed copy by calling the office of [facility].    I understand that my insurance will be billed for this visit.   I have read or had this consent read to me. . I understand the contents of this consent, which adequately explains the benefits and risks of the Services being provided via telemedicine.  . I have been provided ample opportunity to ask questions  regarding this consent and the Services and have had my questions answered to my satisfaction. . I give my informed consent for the services to be provided through the use of telemedicine in my medical care  By participating in this telemedicine visit I agree to the above.

## 2018-11-24 NOTE — Patient Instructions (Signed)
Medication Instructions:  Your physician recommends that you continue on your current medications as directed. Please refer to the Current Medication list given to you today.  If you need a refill on your cardiac medications before your next appointment, please call your pharmacy.   Lab work: None If you have labs (blood work) drawn today and your tests are completely normal, you will receive your results only by: . MyChart Message (if you have MyChart) OR . A paper copy in the mail If you have any lab test that is abnormal or we need to change your treatment, we will call you to review the results.  Testing/Procedures: None  Follow-Up: At [facility], you and your health needs are our priority.  As part of our continuing mission to provide you with exceptional heart care, we have created designated Provider Care Teams.  These Care Teams include your primary Cardiologist (physician) and Advanced Practice Providers (APPs -  Physician Assistants and Nurse Practitioners) who all work together to provide you with the care you need, when you need it. You will need a follow up appointment in:  12 months.  Please call our office 2 months in advance to schedule this appointment.  You may see Philip Nahser, MD or one of the following Advanced Practice Providers on your designated Care Team: Scott Weaver, PA-C Vin Bhagat, PA-C . Janine Hammond, NP  Any Other Special Instructions Will Be Listed Below (If Applicable).    

## 2018-11-24 NOTE — Telephone Encounter (Signed)
Left message to call back  

## 2018-11-24 NOTE — Progress Notes (Signed)
Virtual Visit via Telephone Note   This visit type was conducted due to national recommendations for restrictions regarding the COVID-19 Pandemic (e.g. social distancing) in an effort to limit this patient's exposure and mitigate transmission in our community.  Due to his co-morbid illnesses, this patient is at least at moderate risk for complications without adequate follow up.  This format is felt to be most appropriate for this patient at this time.  The patient did not have access to video technology/had technical difficulties with video requiring transitioning to audio format only (telephone).  All issues noted in this document were discussed and addressed.  No physical exam could be performed with this format.  Please refer to the patient's chart for his  consent to telehealth for [facility].   Date:  11/24/2018   ID:  [name], DOB [birth year], MRN [id number]  Patient Location: Home Provider Location: Office  PCP:  Leanna Battles, MD  Cardiologist:  Mertie Moores, MD  Electrophysiologist:  None    Problem list 1.  Hypertension 2.  Hyperlipidemia 3.  Colon cancer     Chief Complaint  Patient presents with  . Hypertension     Sept. 10, 2019   TYRONN ONNEN is a 53 y.o. male with a hx of hypertension and hyperlipidemia.  I last saw [name] in September, 2013.  Since I last seen him he was diagnosed with well-differentiated adenocarcinoma of the colon ( in March, 2019)   Jibreel is seen after 5 years absence.   Is now in sales,  [employer] to [employer].  His son is at [facility] . Daughter is at [employer].    His first colonoscopy  Shows well differentiated colon cancer  Is going to see Dr. Dema Severin at [facility] . Also sees [employer] .   Has some anxiety - especially with this diagnosis His father also had some additional stents placed this past year.   Still getting regular exercise.   Walks regularly  3-4 days a week.   No CP or dyspnea with  that .    Bp has been mildly elevated. Is on Lisinopril - HCTZ 20-12.5 mg a day  ( med list is not correct today ) Bystolic 20 mg a day .      Evaluation Performed:  Follow-Up Visit  Chief Complaint:  HTN, hyperlipidemia    Sept. 1, 2020    CORDARRYL OHM is a 53 y.o. male with history of  hyperlipidemia hypertension.  He was diagnosed with well differentiated colon cancer in 2018.  BP has been well controlled.   No CP or dyspnea. Is staying active.  More walking with recent Covid pandemic.   Colon cancer is going well   Last labs from Dr. Shon Baton office  ( could not find most recent labs) ,  Will send in labs via mychart.   Chol =  Trigs = HDL= LDL=     The patient does not have symptoms concerning for COVID-19 infection (fever, chills, cough, or new shortness of breath).    Past Medical History:  Diagnosis Date  . Anxiety   . Cancer ([place])   . Colon cancer ([place]) 05/2017   By Dr Paulita Fujita  . GERD (gastroesophageal reflux disease)   . Gout   . HLD (hyperlipidemia)   . Hypertension   . Sleep apnea    Does not use cpap   Past Surgical History:  Procedure Laterality Date  . ANKLE SURGERY  right  . COLONOSCOPY  05/2017   Had adenocarcinoma sigmoid polyp  . LAPAROSCOPY N/A 08/05/2017   Procedure: LAPAROSCOPY DIAGNOSTIC;  Surgeon: Coralie Keens, MD;  Location: [facility];  Service: General;  Laterality: N/A;  . PROCTOSCOPY N/A 08/05/2017   Procedure: RIGID PROCTOSCOPY;  Surgeon: Coralie Keens, MD;  Location: [facility];  Service: General;  Laterality: N/A;     Current Meds  Medication Sig  . allopurinol (ZYLOPRIM) 100 MG tablet Take 100 mg by mouth every evening.  Marland Kitchen ALPRAZolam (NIRAVAM) 0.25 MG dissolvable tablet Take 0.25 mg by mouth 2 (two) times daily as needed for anxiety.  Marland Kitchen aspirin 81 MG tablet Take 81 mg by mouth every evening.   . citalopram (CELEXA) 40 MG tablet Take 40 mg by mouth every evening.   . hydrochlorothiazide (HYDRODIURIL) 25 MG tablet  Take 1 tablet (25 mg total) by mouth daily.  Marland Kitchen losartan (COZAAR) 100 MG tablet Take 1 tablet (100 mg total) by mouth daily.  . Multiple Vitamins-Minerals (MULTIVITAMINS THER. W/MINERALS) TABS Take 1 tablet by mouth every evening.   . Nebivolol HCl 20 MG TABS Take 1 tablet (20 mg total) by mouth daily.  Marland Kitchen omeprazole (PRILOSEC) 20 MG capsule Take 20 mg by mouth every evening.   . potassium chloride (K-DUR) 10 MEQ tablet Take 1 tablet (10 mEq total) by mouth daily.  . simvastatin (ZOCOR) 40 MG tablet Take 1 tablet (40 mg total) by mouth at bedtime.  Marland Kitchen testosterone cypionate (DEPO-TESTOSTERONE) 200 MG/ML injection Inject 300 mg into the muscle every 21 ( twenty-one) days.     Allergies:   Cardizem [diltiazem hcl] and Toprol xl [metoprolol tartrate]   Social History   Tobacco Use  . Smoking status: Never Smoker  . Smokeless tobacco: Never Used  Substance Use Topics  . Alcohol use: Yes    Alcohol/week: 14.0 standard drinks    Types: 14 Glasses of wine per week  . Drug use: Not Currently     Family Hx: The patient's family history includes Colon polyps in his sister; Coronary artery disease in an other family member; Heart disease in his father; Hypertension in an other family member; Lung cancer in his mother. There is no history of Colon cancer, Esophageal cancer, Stomach cancer, or Rectal cancer.  ROS:   Please see the history of present illness.     All other systems reviewed and are negative.   Prior CV studies:   The following studies were reviewed today:    Labs/Other Tests and Data Reviewed:    EKG:  No ECG reviewed.  Recent Labs: 01/20/2018: BUN 14; Creatinine, Ser 1.06; Potassium 4.6; Sodium 134   Recent Lipid Panel No results found for: CHOL, TRIG, HDL, CHOLHDL, LDLCALC, LDLDIRECT  Wt Readings from Last 3 Encounters:  11/24/18 240 lb (108.9 kg)  08/11/18 251 lb (113.9 kg)  06/01/18 249 lb (112.9 kg)     Objective:    Vital Signs:  BP 130/80   Pulse 76    Ht 6\' 3"  (1.905 m)   Wt 240 lb (108.9 kg)   BMI 30.00 kg/m      ASSESSMENT & PLAN:    1. Essential hypertension: Blood pressure is well controlled.  Continue current medications.  2.  Hyperlipidemia: This is being managed by Dr. Sharlett Iles.  Continue simvastatin .  He will try to find his labs on the [facility] medical portal and will send those into [place] via MyChart.    COVID-19 Education: The signs and symptoms of COVID-19  were discussed with the patient and how to seek care for testing (follow up with PCP or arrange E-visit).  The importance of social distancing was discussed today.  Time:   Today, I have spent  19 minutes with the patient with telehealth technology discussing the above problems.     Medication Adjustments/Labs and Tests Ordered: Current medicines are reviewed at length with the patient today.  Concerns regarding medicines are outlined above.   Tests Ordered: No orders of the defined types were placed in this encounter.   Medication Changes: No orders of the defined types were placed in this encounter.   Follow Up:  In Person in 1 year(s)  Signed, Mertie Moores, MD  11/24/2018 9:55 AM    [place]

## 2018-12-04 ENCOUNTER — Other Ambulatory Visit: Payer: Self-pay | Admitting: Cardiovascular Disease

## 2019-08-12 ENCOUNTER — Other Ambulatory Visit: Payer: Self-pay | Admitting: Internal Medicine

## 2019-08-12 DIAGNOSIS — R1011 Right upper quadrant pain: Secondary | ICD-10-CM

## 2019-08-12 DIAGNOSIS — C189 Malignant neoplasm of colon, unspecified: Secondary | ICD-10-CM

## 2019-08-24 ENCOUNTER — Ambulatory Visit
Admission: RE | Admit: 2019-08-24 | Discharge: 2019-08-24 | Disposition: A | Discharge status: Home / Self Care | Payer: 59 | Source: Ambulatory Visit | Attending: Internal Medicine | Admitting: Internal Medicine

## 2019-08-24 DIAGNOSIS — C189 Malignant neoplasm of colon, unspecified: Secondary | ICD-10-CM

## 2019-08-24 DIAGNOSIS — R1011 Right upper quadrant pain: Secondary | ICD-10-CM

## 2019-10-29 ENCOUNTER — Other Ambulatory Visit: Payer: Self-pay | Admitting: Cardiovascular Disease

## 2020-01-27 ENCOUNTER — Other Ambulatory Visit: Payer: Self-pay | Admitting: Cardiovascular Disease

## 2020-01-27 MED ORDER — LOSARTAN POTASSIUM 100 MG PO TABS: 100.0000 mg | ORAL_TABLET | Freq: Every day | ORAL | 0 refills | Status: DC

## 2020-03-27 ENCOUNTER — Other Ambulatory Visit: Payer: Self-pay

## 2020-03-27 MED ORDER — LOSARTAN POTASSIUM 100 MG PO TABS: 100.0000 mg | ORAL_TABLET | Freq: Every day | ORAL | 0 refills | Status: DC

## 2020-03-27 MED ORDER — POTASSIUM CHLORIDE ER 10 MEQ PO TBCR: 10.0000 meq | EXTENDED_RELEASE_TABLET | Freq: Every day | ORAL | 0 refills | Status: DC

## 2020-03-27 MED ORDER — HYDROCHLOROTHIAZIDE 25 MG PO TABS: 25.0000 mg | ORAL_TABLET | Freq: Every day | ORAL | 0 refills | Status: DC

## 2020-04-01 IMAGING — CT CT CHEST W/ CM
2 of 5 series · 12 of 36 positions shown, 15 images · IV contrast (ISOVUE 300)
Comparison: None.

CLINICAL DATA: Pt had routine colonscopy last week, screening
colonoscopy 51 years of age. Found a cancerous polyp sigmoid colon.
Staging. No treatments yet.

EXAM:
CT CHEST, ABDOMEN, AND PELVIS WITH CONTRAST
TECHNIQUE: Multidetector CT imaging of the chest, abdomen and pelvis was
performed following the standard protocol during bolus
administration of intravenous contrast.
CONTRAST:  100mL OMNIPAQUE IOHEXOL 300 MG/ML  SOLN

[Series 2: cap with · axial · 0.84mm/px · z∈[+1210,+1786]mm · 9 of 145 slices shown, 12 images]
[im 15/145  mediastinal]
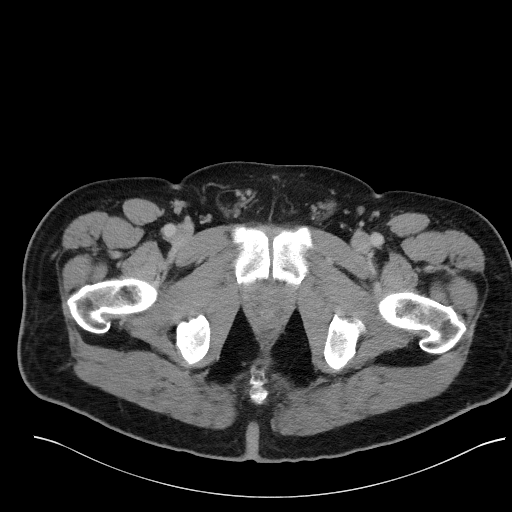
[im 15/145  lung]
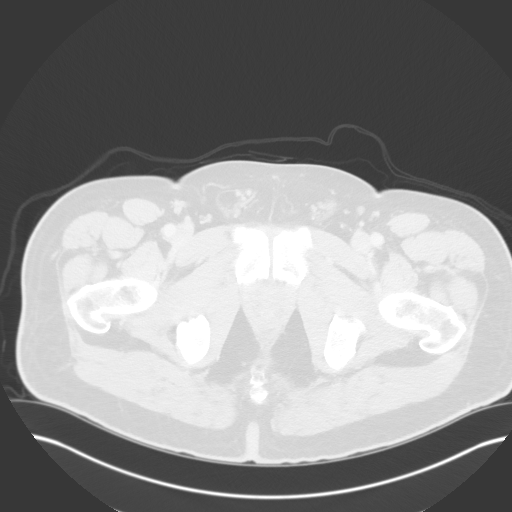
[im 29/145  lung]
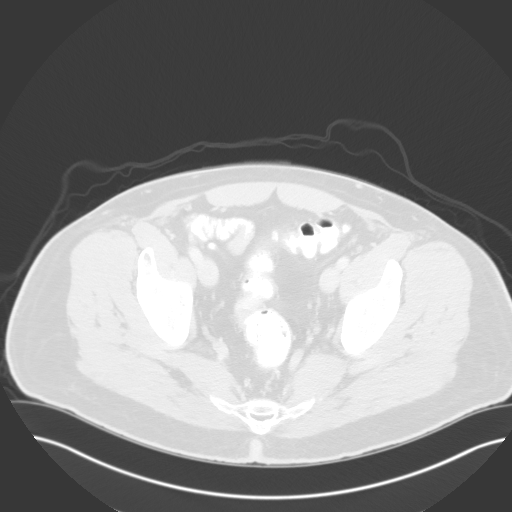
[im 44/145  lung]
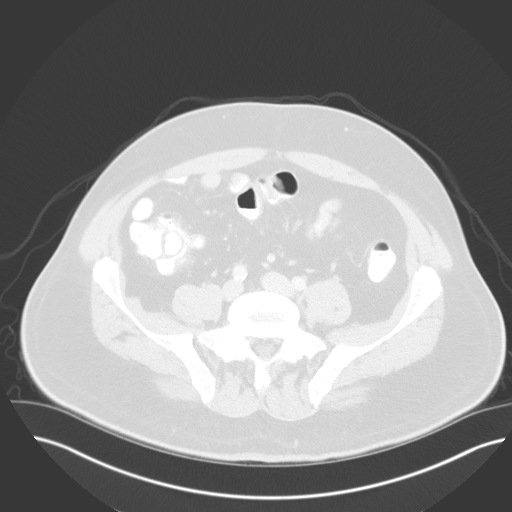
[im 58/145  lung]
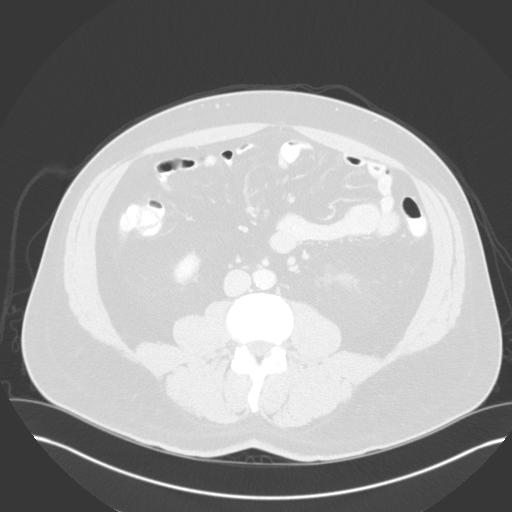
[im 73/145  mediastinal]
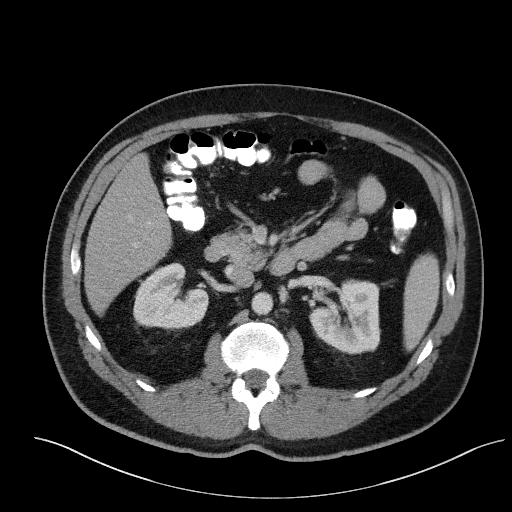
[im 73/145  lung]
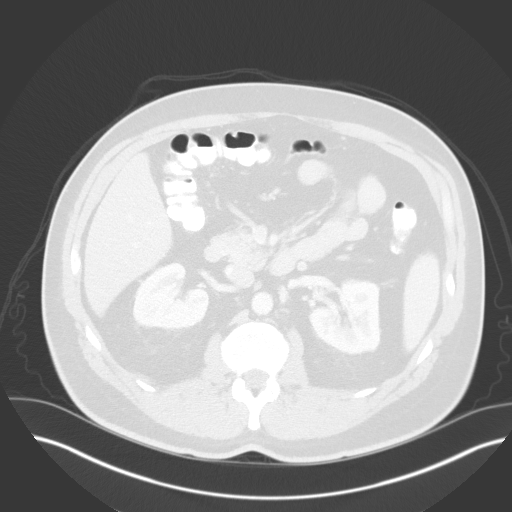
[im 87/145  lung]
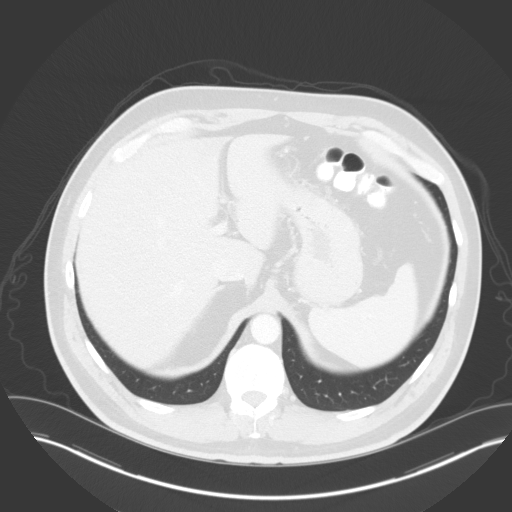
[im 101/145  lung]
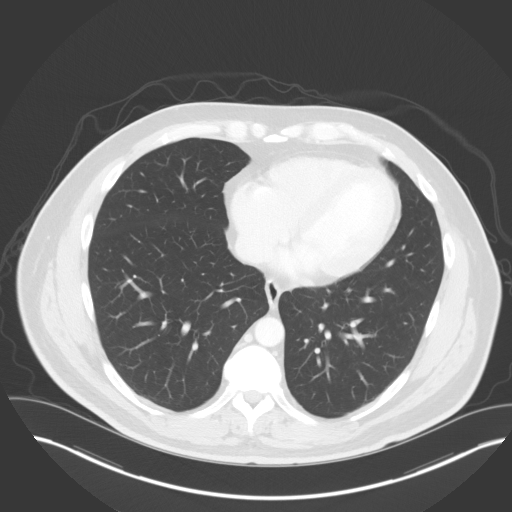
[im 116/145  lung]
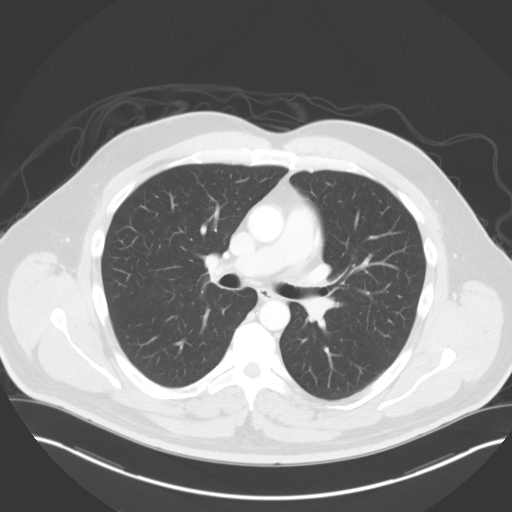
[im 130/145  mediastinal]
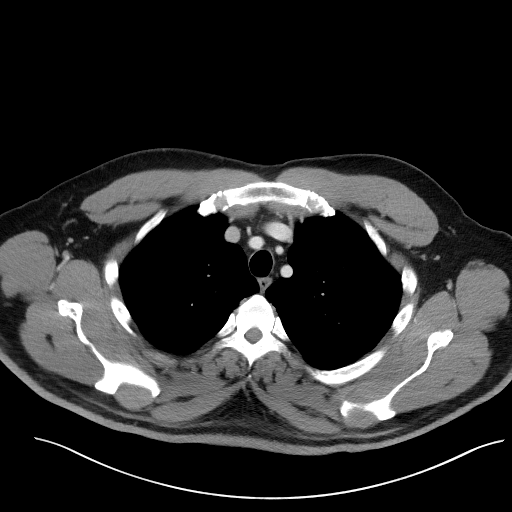
[im 130/145  lung]
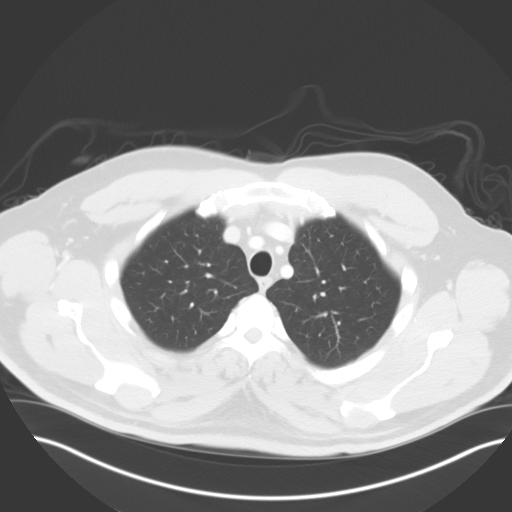

[Series 4: coronals · coronal · 1.02mm/px · 3 of 145 slices shown]
[im 29/145  lung]
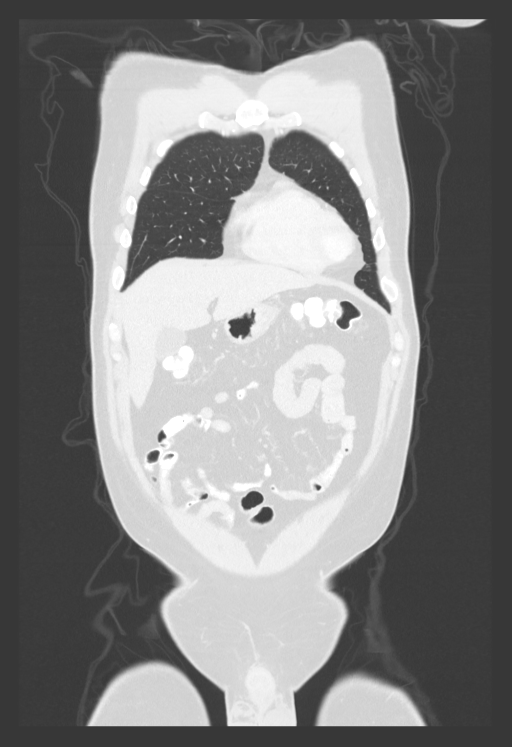
[im 58/145  lung]
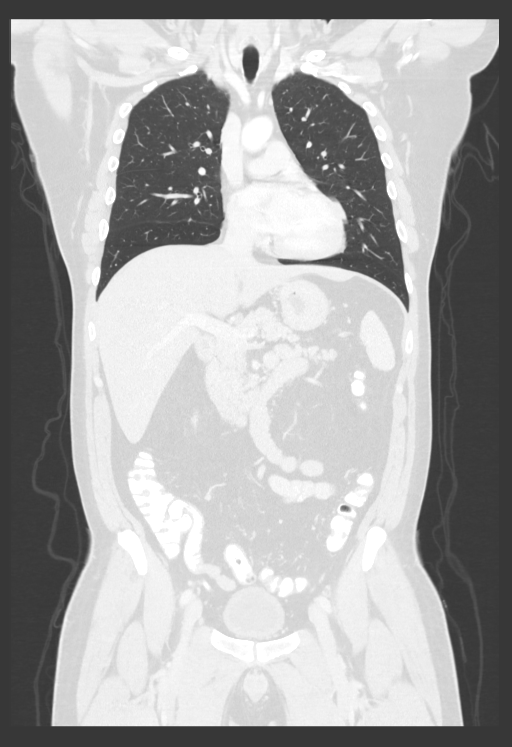
[im 87/145  lung]
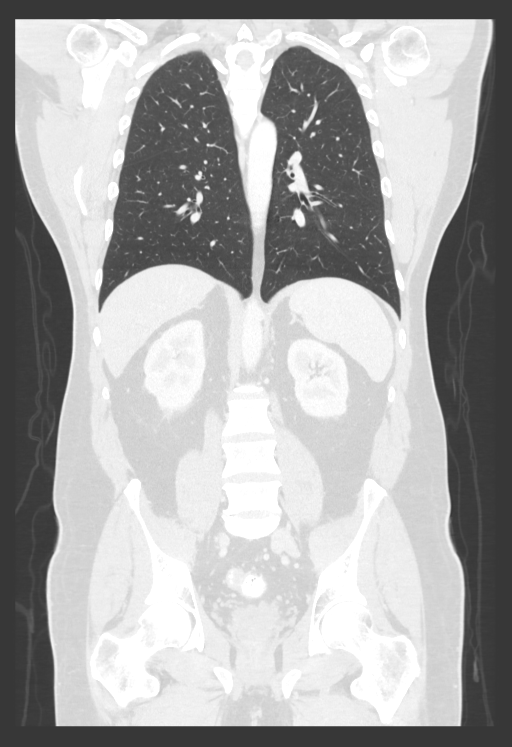

[12 of 36 positions shown; findings below may reference images not displayed]

FINDINGS: CT CHEST FINDINGS

Cardiovascular: No significant vascular findings. Normal heart size.
No pericardial effusion.

Mediastinum/Nodes: No axillary or supraclavicular adenopathy. No
mediastinal or hilar adenopathy. No pericardial effusion. Esophagus
normal.

Lungs/Pleura: No suspicious pulmonary nodules.  Airways normal.

Musculoskeletal: No aggressive osseous lesion.

CT ABDOMEN AND PELVIS FINDINGS

Hepatobiliary: No focal hepatic lesion. No biliary ductal
dilatation. Gallbladder is normal. Common bile duct is normal.

Pancreas: Pancreas is normal. No ductal dilatation. No pancreatic
inflammation.

Spleen: Normal spleen

Adrenals/urinary tract: Adrenal glands normal. Low-density lesion in
the mid cortex of the LEFT kidney measuring 8 mm (image 71/2) has
simple fluid attenuation most consistent with benign cyst. Ureters
and bladder normal.

Stomach/Bowel: Small hiatal hernia. Stomach, duodenum small-bowel
normal. Appendix normal. Ascending and transverse colon normal.
Descending colon and sigmoid colon normal. Rectum normal.

Vascular/Lymphatic: Minimal intimal calcification aorta. No
aneurysm.

No retroperitoneal adenopathy. No periportal adenopathy. No
mesenteric adenopathy. No perirectal adenopathy and adenopathy.

Reproductive: Prostate normal with benign central calcification.
Seminal vesicles grossly normal. Small fat filled RIGHT inguinal
hernia.

Other: No peritoneal disease

Musculoskeletal: No aggressive osseous lesion. Lucent lesion in the
T11 vertebral body has imaging characteristics most consistent
benign hemangioma.
IMPRESSION: Chest Impression:

1. No thoracic metastasis.

Abdomen / Pelvis Impression:

1. No evidence of primary malignancy in the sigmoid colon. No
adenopathy or metastatic disease in the abdomen or pelvis.
2. Small hiatal hernia.
3. Minimal intimal aortic calcification.

## 2020-04-10 ENCOUNTER — Other Ambulatory Visit: Payer: Self-pay

## 2020-04-10 MED ORDER — POTASSIUM CHLORIDE ER 10 MEQ PO TBCR: 10.0000 meq | EXTENDED_RELEASE_TABLET | Freq: Every day | ORAL | 0 refills | Status: DC

## 2020-04-10 MED ORDER — HYDROCHLOROTHIAZIDE 25 MG PO TABS: 25.0000 mg | ORAL_TABLET | Freq: Every day | ORAL | 0 refills | Status: DC

## 2020-04-10 MED ORDER — LOSARTAN POTASSIUM 100 MG PO TABS: 100.0000 mg | ORAL_TABLET | Freq: Every day | ORAL | 0 refills | Status: DC

## 2020-04-10 NOTE — Addendum Note (Signed)
Addended by: Carter Kitten D on: 04/10/2020 03:00 PM   Modules accepted: Orders

## 2020-04-19 NOTE — Progress Notes (Signed)
Cardiology Office Note:    Date:  04/20/2020   ID:  [name], DOB [birth year], MRN [id number]  PCP:  Leanna Battles, MD  Cardiologist:  Acie Fredrickson  Referring MD: Leanna Battles, MD   Problem list 1.  Hypertension 2.  Hyperlipidemia 3.  Colon cancer  Chief Complaint  Patient presents with  . Hypertension     Sept. 10, 2019   Derek [name] is a 55 y.o. male with a hx of hypertension and hyperlipidemia.  I last saw Derek [name] in September, 2013.  Since I last seen him he was diagnosed with well-differentiated adenocarcinoma of the colon ( in March, 2019)   [name] is seen after 5 years absence.   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  His son is at [employer] . Daughter is at [employer].    His first colonoscopy  Shows well differentiated colon cancer  Is going to see Dr. Dema Severin at [facility] . Also sees [employer] .   Has some anxiety - especially with this diagnosis His father also had some additional stents placed this past year.   Still getting regular exercise.   Walks regularly  3-4 days a week.   No CP or dyspnea with that .    Bp has been mildly elevated. Is on Lisinopril - HCTZ 20-12.5 mg a day  ( med list is not correct today ) Bystolic 20 mg a day .    Jan. 27, 2022  Derek [name] is seen today for follow up of his HTN and HLD. Has a hx of colon cancer  Walks every other day .  Trail walks No CP with walking .  Colon cancer has been stable    Past Medical History:  Diagnosis Date  . Anxiety   . Cancer ([place])   . Colon cancer ([place]) 05/2017   By Dr Paulita Fujita  . GERD (gastroesophageal reflux disease)   . Gout   . HLD (hyperlipidemia)   . Hypertension   . Sleep apnea    Does not use cpap    Past Surgical History:  Procedure Laterality Date  . ANKLE SURGERY     right  . COLONOSCOPY  05/2017   Had adenocarcinoma sigmoid polyp  . LAPAROSCOPY N/A 08/05/2017   Procedure: LAPAROSCOPY DIAGNOSTIC;  Surgeon: Coralie Keens, MD;  Location: [place];   Service: General;  Laterality: N/A;  . PROCTOSCOPY N/A 08/05/2017   Procedure: RIGID PROCTOSCOPY;  Surgeon: Coralie Keens, MD;  Location: [place];  Service: General;  Laterality: N/A;    Current Medications: Current Meds  Medication Sig  . allopurinol (ZYLOPRIM) 100 MG tablet Take 100 mg by mouth every evening.  Marland Kitchen ALPRAZolam (NIRAVAM) 0.25 MG dissolvable tablet Take 0.25 mg by mouth 2 (two) times daily as needed for anxiety.  . ALPRAZolam (XANAX) 0.25 MG tablet Take 0.25 mg by mouth 3 (three) times daily as needed.  Marland Kitchen aspirin 81 MG tablet Take 81 mg by mouth every evening.  . citalopram (CELEXA) 40 MG tablet Take 40 mg by mouth every evening.  . hydrochlorothiazide (HYDRODIURIL) 25 MG tablet Take 1 tablet (25 mg total) by mouth daily.  Marland Kitchen losartan (COZAAR) 100 MG tablet Take 1 tablet (100 mg total) by mouth daily.  . Multiple Vitamins-Minerals (MULTIVITAMINS THER. W/MINERALS) TABS Take 1 tablet by mouth every evening.  . Nebivolol HCl 20 MG TABS Take 1 tablet (20 mg total) by mouth daily.  Marland Kitchen omeprazole (PRILOSEC) 20 MG capsule Take 20 mg by  mouth every evening.  . potassium chloride (KLOR-CON) 10 MEQ tablet Take 1 tablet (10 mEq total) by mouth daily. Please keep upcoming appt in January 2022 with Dr. Acie Fredrickson before anymore refills. Thank you  . simvastatin (ZOCOR) 40 MG tablet Take 1 tablet (40 mg total) by mouth at bedtime.  Marland Kitchen testosterone cypionate (DEPOTESTOSTERONE CYPIONATE) 200 MG/ML injection Inject 300 mg into the muscle every 21 ( twenty-one) days.     Allergies:   Cardizem [diltiazem hcl] and Toprol xl [metoprolol tartrate]   Social History   Socioeconomic History  . Marital status: Married    Spouse name: Not on file  . Number of children: Not on file  . Years of education: Not on file  . Highest education level: Not on file  Occupational History  . Not on file  Tobacco Use  . Smoking status: Never Smoker  . Smokeless tobacco: Never Used  Vaping Use  . Vaping Use:  Never used  Substance and Sexual Activity  . Alcohol use: Yes    Alcohol/week: 14.0 standard drinks    Types: 14 Glasses of wine per week  . Drug use: Not Currently  . Sexual activity: Not on file  Other Topics Concern  . Not on file  Social History Narrative  . Not on file   Social Determinants of Health   Financial Resource Strain: Not on file  Food Insecurity: Not on file  Transportation Needs: Not on file  Physical Activity: Not on file  Stress: Not on file  Social Connections: Not on file     Family History: The patient's family history includes Colon polyps in his sister; Coronary artery disease in an other family member; Heart disease in his father; Hypertension in an other family member; Lung cancer in his mother. There is no history of Colon cancer, Esophageal cancer, Stomach cancer, or Rectal cancer.  ROS:   Please see the history of present illness.     All other systems reviewed and are negative.  EKGs/Labs/Other Studies Reviewed:    The following studies were reviewed today:   Recent Labs: No results found for requested labs within last 8760 hours.  Recent Lipid Panel No results found for: CHOL, TRIG, HDL, CHOLHDL, VLDL, LDLCALC, LDLDIRECT  Physical Exam:    Physical Exam: Blood pressure 122/84, pulse 72, height 6\' 3"  (1.905 m), weight 237 lb (107.5 kg), SpO2 96 %.  GEN:  Well nourished, well developed in no acute distress HEENT: Normal NECK: No JVD; No carotid bruits LYMPHATICS: No lymphadenopathy CARDIAC: RR, no murmurs, rubs, gallops RESPIRATORY:  Clear to auscultation without rales, wheezing or rhonchi  ABDOMEN: Soft, non-tender, non-distended MUSCULOSKELETAL:  No edema; No deformity  SKIN: Warm and dry NEUROLOGIC:  Alert and oriented x 3   ECG : April 20, 2020: Normal sinus rhythm at 72.  No ST or T wave changes.   ASSESSMENT:    1. Essential hypertension   2. Mixed hyperlipidemia    PLAN:      1.  Hypertension: Blood pressure is  well controlled.  Continue current medications.  2.  Hyperlipidemia: Cholesterol levels are okay.  His triglyceride level remains elevated.  I have asked him to price the SAP and also fenofibrate.  I think he may need to be on 1 of these medications.  He has already eliminated his carbohydrates from his diet.  He needs to continue exercising.  See him again in 1 year for follow-up office visit.   Medication Adjustments/Labs and Tests Ordered: Current  medicines are reviewed at length with the patient today.  Concerns regarding medicines are outlined above.  Orders Placed This Encounter  Procedures  . EKG 12-Lead   No orders of the defined types were placed in this encounter.   Patient Instructions  Medication Instructions:  Your physician recommends that you continue on your current medications as directed. Please refer to the Current Medication list given to you today.  *If you need a refill on your cardiac medications before your next appointment, please call your pharmacy*   Lab Work: None If you have labs (blood work) drawn today and your tests are completely normal, you will receive your results only by: Marland Kitchen MyChart Message (if you have MyChart) OR . A paper copy in the mail If you have any lab test that is abnormal or we need to change your treatment, we will call you to review the results.   Testing/Procedures: None   Follow-Up: At [facility], you and your health needs are our priority.  As part of our continuing mission to provide you with exceptional heart care, we have created designated Provider Care Teams.  These Care Teams include your primary Cardiologist (physician) and Advanced Practice Providers (APPs -  Physician Assistants and Nurse Practitioners) who all work together to provide you with the care you need, when you need it.  We recommend signing up for the patient portal called "MyChart".  Sign up information is provided on this After Visit Summary.  MyChart  is used to connect with patients for Virtual Visits (Telemedicine).  Patients are able to view lab/test results, encounter notes, upcoming appointments, etc.  Non-urgent messages can be sent to your provider as well.   To learn more about what you can do with MyChart, go to [email or web address].    Your next appointment:   1 year(s)  The format for your next appointment:   In Person  Provider:   You may see Mertie Moores, MD or one of the following Advanced Practice Providers on your designated Care Team:    Richardson Dopp, PA-C  Vin [place], [place]    Other Instructions    Check price of Vasepa 2 gm twice a day   Other wise, we will consider Fenofibrate 145 mg a day .       Signed, Mertie Moores, MD  04/20/2020 5:54 PM    [place]

## 2020-04-20 ENCOUNTER — Encounter: Payer: Self-pay | Admitting: Cardiovascular Disease

## 2020-04-20 ENCOUNTER — Ambulatory Visit (INDEPENDENT_AMBULATORY_CARE_PROVIDER_SITE_OTHER): Payer: 59 | Admitting: Cardiovascular Disease

## 2020-04-20 ENCOUNTER — Other Ambulatory Visit: Payer: Self-pay

## 2020-04-20 VITALS — BP 122/84 | HR 72 | Ht 75.0 in | Wt 237.0 lb

## 2020-04-20 DIAGNOSIS — I10 Essential (primary) hypertension: Secondary | ICD-10-CM

## 2020-04-20 DIAGNOSIS — E782 Mixed hyperlipidemia: Secondary | ICD-10-CM

## 2020-04-20 NOTE — Patient Instructions (Addendum)
Medication Instructions:  Your physician recommends that you continue on your current medications as directed. Please refer to the Current Medication list given to you today.  *If you need a refill on your cardiac medications before your next appointment, please call your pharmacy*   Lab Work: None If you have labs (blood work) drawn today and your tests are completely normal, you will receive your results only by: Marland Kitchen MyChart Message (if you have MyChart) OR . A paper copy in the mail If you have any lab test that is abnormal or we need to change your treatment, we will call you to review the results.   Testing/Procedures: None   Follow-Up: At [facility], you and your health needs are our priority.  As part of our continuing mission to provide you with exceptional heart care, we have created designated Provider Care Teams.  These Care Teams include your primary Cardiologist (physician) and Advanced Practice Providers (APPs -  Physician Assistants and Nurse Practitioners) who all work together to provide you with the care you need, when you need it.  We recommend signing up for the patient portal called "MyChart".  Sign up information is provided on this After Visit Summary.  MyChart is used to connect with patients for Virtual Visits (Telemedicine).  Patients are able to view lab/test results, encounter notes, upcoming appointments, etc.  Non-urgent messages can be sent to your provider as well.   To learn more about what you can do with MyChart, go to [email or web address].    Your next appointment:   1 year(s)  The format for your next appointment:   In Person  Provider:   You may see Mertie Moores, MD or one of the following Advanced Practice Providers on your designated Care Team:    Richardson Dopp, PA-C  [place]in [place], [place]    Other Instructions    Check price of Vasepa 2 gm twice a day   Other wise, we will consider Fenofibrate 145 mg a day .

## 2020-05-05 ENCOUNTER — Other Ambulatory Visit: Payer: Self-pay

## 2020-05-05 MED ORDER — LOSARTAN POTASSIUM 100 MG PO TABS: 100.0000 mg | ORAL_TABLET | Freq: Every day | ORAL | 3 refills | Status: DC

## 2020-05-05 NOTE — Telephone Encounter (Signed)
Called pt to inform him that [name] is requesting a alternative for losartan, because it is on backorder and they wanted Dr. Acie Fredrickson to prescribe an alternative. Pt stated that he wanted to stay on the same medication and wanted it sent to a local pharmacy, [facility]. Rx sent as requested. Confirmation received.

## 2020-05-31 ENCOUNTER — Other Ambulatory Visit: Payer: Self-pay | Admitting: Cardiovascular Disease

## 2020-06-08 ENCOUNTER — Other Ambulatory Visit: Payer: Self-pay

## 2020-06-08 MED ORDER — LOSARTAN POTASSIUM 100 MG PO TABS: 100.0000 mg | ORAL_TABLET | Freq: Every day | ORAL | 3 refills | Status: DC

## 2020-06-08 NOTE — Telephone Encounter (Signed)
Pt wanted medication losartan sent to a local pharmacy, so pt can stay on the medication confirmation received.

## 2020-09-26 ENCOUNTER — Telehealth: Payer: Self-pay

## 2020-09-26 MED ORDER — NEBIVOLOL HCL 20 MG PO TABS: 20.0000 mg | ORAL_TABLET | Freq: Every day | ORAL | 2 refills | Status: DC

## 2020-09-26 NOTE — Telephone Encounter (Signed)
Received call from patient stating his Bystolic rx has been denied. He stated his insurance denied the prior authorization but since the medication is on thirty-three dollars he is willing to pay for the medication out of pocket. He requested I send the rx to [place] [facility].

## 2021-04-22 ENCOUNTER — Other Ambulatory Visit: Payer: Self-pay | Admitting: Cardiovascular Disease

## 2021-05-22 ENCOUNTER — Other Ambulatory Visit: Payer: Self-pay | Admitting: *Deleted

## 2021-05-22 MED ORDER — HYDROCHLOROTHIAZIDE 25 MG PO TABS: 25.0000 mg | ORAL_TABLET | Freq: Every day | ORAL | 0 refills | Status: DC

## 2021-05-22 MED ORDER — POTASSIUM CHLORIDE ER 10 MEQ PO TBCR: 10.0000 meq | EXTENDED_RELEASE_TABLET | Freq: Every day | ORAL | 0 refills | Status: DC

## 2021-05-22 MED ORDER — LOSARTAN POTASSIUM 100 MG PO TABS: 100.0000 mg | ORAL_TABLET | Freq: Every day | ORAL | 0 refills | Status: DC

## 2021-05-25 ENCOUNTER — Other Ambulatory Visit: Payer: Self-pay | Admitting: Cardiovascular Disease

## 2021-06-21 ENCOUNTER — Telehealth: Payer: Self-pay

## 2021-06-23 ENCOUNTER — Other Ambulatory Visit: Payer: Self-pay | Admitting: Cardiovascular Disease

## 2021-06-25 ENCOUNTER — Other Ambulatory Visit: Payer: Self-pay | Admitting: *Deleted

## 2021-06-25 MED ORDER — LOSARTAN POTASSIUM 100 MG PO TABS: 100.0000 mg | ORAL_TABLET | Freq: Every day | ORAL | 0 refills | Status: DC

## 2021-06-27 ENCOUNTER — Other Ambulatory Visit: Payer: Self-pay | Admitting: *Deleted

## 2021-06-27 MED ORDER — LOSARTAN POTASSIUM 100 MG PO TABS: 100.0000 mg | ORAL_TABLET | Freq: Every day | ORAL | 0 refills | Status: DC

## 2021-07-04 NOTE — Telephone Encounter (Signed)
Pt. Made an in person appointment with Dr. Acie Fredrickson on 07/16/21 ?

## 2021-07-15 ENCOUNTER — Encounter: Payer: Self-pay | Admitting: Cardiovascular Disease

## 2021-07-15 NOTE — Progress Notes (Signed)
?Cardiology Office Note:   ? ?Date:  07/16/2021  ? ?ID:  Derek [name], DOB 1965-10-24, MRN [id number] ? ?PCP:  Donnajean Lopes, MD  ?Cardiologist:  Verdia Bolt ? ?Referring MD: Donnajean Lopes, MD  ? ?Problem list ?1.  Hypertension ?2.  Hyperlipidemia ?3.  Colon cancer ? ?Chief Complaint  ?Patient presents with  ? Hypertension  ?   ?  ?  ? ?Sept. 10, 2019  ? ?TRUEMAN WORLDS is a 56 y.o. male with a hx of hypertension and hyperlipidemia.  I last saw Derek [name] in September, 2013.  Since I last seen him he was diagnosed with well-differentiated adenocarcinoma of the colon ( in March, 2019)  ? ?Derek [name] is seen after 5 years absence.   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  ?His son is at [employer] . ?Daughter is at [employer].   ? ?His first colonoscopy  Shows well differentiated colon cancer  ?Is going to see Dr. Dema Severin at [facility] . ?Also sees [employer] .  ? ?Has some anxiety - especially with this diagnosis ?His father also had some additional stents placed this past year.  ? ?Still getting regular exercise.   Walks regularly  3-4 days a week.   ?No CP or dyspnea with that .   ? ?Bp has been mildly elevated. Is on Lisinopril - HCTZ 20-12.5 mg a day  ( med list is not correct today ) ?Bystolic 20 mg a day .   ? ?Jan. 27, 2022 ? ?Derek [name] is seen today for follow up of his HTN and HLD. ?Has a hx of colon cancer  ?Walks every other day .  Trail walks ?No CP with walking .  ?Colon cancer has been stable  ? ?July 16, 2021 ?Derek [name]  is seen today for follow up of his HTN, HLD  ?Is exercising , walks his cocker spaniel regularly  ? ?Labs from Dr. Sharlett Iles reveals ?Total cholesterol is 153 ?HDL is 46 ?LDL is 55 ?Triglycerides are 262. ?We discussed ? ?Past Medical History:  ?Diagnosis Date  ? Anxiety   ? Cancer [facility])   ? Colon cancer ([place]) 05/2017  ? By Dr Paulita Fujita  ? GERD (gastroesophageal reflux disease)   ? Gout   ? HLD (hyperlipidemia)   ? Hypertension   ? Sleep apnea   ? Does not use cpap  ? ? ?Past Surgical  History:  ?Procedure Laterality Date  ? ANKLE SURGERY    ? right  ? COLONOSCOPY  05/2017  ? Had adenocarcinoma sigmoid polyp  ? LAPAROSCOPY N/A 08/05/2017  ? Procedure: LAPAROSCOPY DIAGNOSTIC;  Surgeon: Coralie Keens, MD;  Location: [place];  Service: General;  Laterality: N/A;  ? PROCTOSCOPY N/A 08/05/2017  ? Procedure: RIGID PROCTOSCOPY;  Surgeon: Coralie Keens, MD;  Location: [place];  Service: General;  Laterality: N/A;  ? ? ?Current Medications: ?Current Meds  ?Medication Sig  ? allopurinol (ZYLOPRIM) 100 MG tablet Take 100 mg by mouth every evening.  ? ALPRAZolam (XANAX) 0.25 MG tablet Take 0.25 mg by mouth 3 (three) times daily as needed.  ? aspirin 81 MG tablet Take 81 mg by mouth every evening.  ? citalopram (CELEXA) 40 MG tablet Take 40 mg by mouth every evening.  ? hydrOXYzine (VISTARIL) 25 MG capsule Take 25 mg by mouth at bedtime as needed.  ? Multiple Vitamins-Minerals (MULTIVITAMINS THER. W/MINERALS) TABS Take 1 tablet by mouth every evening.  ? omeprazole (PRILOSEC) 20 MG capsule Take 20 mg by  mouth every evening.  ? testosterone cypionate (DEPOTESTOSTERONE CYPIONATE) 200 MG/ML injection Inject 300 mg into the muscle every 21 ( twenty-one) days.  ? [DISCONTINUED] hydrochlorothiazide (HYDRODIURIL) 25 MG tablet Take 1 tablet by mouth daily. **Please schedule appointment for further refills. Can only dispense 15 tablets at this time.**  ? [DISCONTINUED] losartan (COZAAR) 100 MG tablet Take 1 tablet (100 mg total) by mouth daily.  ? [DISCONTINUED] Nebivolol HCl 20 MG TABS Take 1 tablet (20 mg total) by mouth daily.  ? [DISCONTINUED] potassium chloride (KLOR-CON) 10 MEQ tablet Take 1 tablet by mouth daily. **Please schedule appointment for further refills-can only dispense 15 tablets at this time.**  ? [DISCONTINUED] simvastatin (ZOCOR) 40 MG tablet Take 1 tablet (40 mg total) by mouth at bedtime.  ?  ? ?Allergies:   Cardizem [diltiazem hcl] and Toprol xl [metoprolol tartrate]  ? ?Social History   ? ?Socioeconomic History  ? Marital status: Married  ?  Spouse name: Not on file  ? Number of children: Not on file  ? Years of education: Not on file  ? Highest education level: Not on file  ?Occupational History  ? Not on file  ?Tobacco Use  ? Smoking status: Never  ? Smokeless tobacco: Never  ?Vaping Use  ? Vaping Use: Never used  ?Substance and Sexual Activity  ? Alcohol use: Yes  ?  Alcohol/week: 14.0 standard drinks  ?  Types: 14 Glasses of wine per week  ? Drug use: Not Currently  ? Sexual activity: Not on file  ?Other Topics Concern  ? Not on file  ?Social History Narrative  ? Not on file  ? ?Social Determinants of Health  ? ?Financial Resource Strain: Not on file  ?Food Insecurity: Not on file  ?Transportation Needs: Not on file  ?Physical Activity: Not on file  ?Stress: Not on file  ?Social Connections: Not on file  ?  ? ?Family History: ?The patient's family history includes Colon polyps in his sister; Coronary artery disease in an other family member; Heart disease in his father; Hypertension in an other family member; Lung cancer in his mother. There is no history of Colon cancer, Esophageal cancer, Stomach cancer, or Rectal cancer. ? ?ROS:   ?Please see the history of present illness.    ? All other systems reviewed and are negative. ? ?EKGs/Labs/Other Studies Reviewed:   ? ?The following studies were reviewed today: ? ? ?Recent Labs: ?No results found for requested labs within last 8760 hours.  ?Recent Lipid Panel ?No results found for: CHOL, TRIG, HDL, CHOLHDL, VLDL, LDLCALC, LDLDIRECT ? ?Physical Exam:   ? ?Physical Exam: ?Blood pressure 130/78, pulse 73, height '6\' 3"'$  (1.905 m), weight 245 lb 6.4 oz (111.3 kg), SpO2 96 %. ? ?GEN:  Well nourished, well developed in no acute distress ?HEENT: Normal ?NECK: No JVD; No carotid bruits ?LYMPHATICS: No lymphadenopathy ?CARDIAC: RRR , no murmurs, rubs, gallops ?RESPIRATORY:  Clear to auscultation without rales, wheezing or rhonchi  ?ABDOMEN: Soft,  non-tender, non-distended ?MUSCULOSKELETAL:  No edema; No deformity  ?SKIN: Warm and dry ?NEUROLOGIC:  Alert and oriented x 3 ? ? ?ECG : July 16, 2021: Normal sinus rhythm at 73.  No ST or T wave changes. ? ? ?ASSESSMENT:   ? ?1. Essential hypertension   ?2. Mixed hyperlipidemia   ? ? ?PLAN:   ? ? ? ?1.  Hypertension: Blood pressure is well controlled.  Continue current medications ? ?2.  Hyperlipidemia:  ?.  Labs from Dr. Buel Ream office show  his cholesterol levels are normal.  He does have high triglycerides.  We discussed minimizing his carbohydrates.  We specifically discussed avoiding foods that are white, wheat, sweet. ? ? ? ? ?Medication Adjustments/Labs and Tests Ordered: ?Current medicines are reviewed at length with the patient today.  Concerns regarding medicines are outlined above.  ?Orders Placed This Encounter  ?Procedures  ? EKG 12-Lead  ? ?Meds ordered this encounter  ?Medications  ? hydrochlorothiazide (HYDRODIURIL) 25 MG tablet  ?  Sig: Take 1 tablet by mouth daily  ?  Dispense:  90 tablet  ?  Refill:  3  ? losartan (COZAAR) 100 MG tablet  ?  Sig: Take 1 tablet (100 mg total) by mouth daily.  ?  Dispense:  90 tablet  ?  Refill:  3  ? Nebivolol HCl 20 MG TABS  ?  Sig: Take 1 tablet (20 mg total) by mouth daily.  ?  Dispense:  90 tablet  ?  Refill:  3  ? potassium chloride (KLOR-CON) 10 MEQ tablet  ?  Sig: Take 1 tablet by mouth daily  ?  Dispense:  90 tablet  ?  Refill:  3  ? simvastatin (ZOCOR) 40 MG tablet  ?  Sig: Take 1 tablet (40 mg total) by mouth at bedtime.  ?  Dispense:  90 tablet  ?  Refill:  3  ? ? ? ?Patient Instructions  ?Medication Instructions:  ?Your physician recommends that you continue on your current medications as directed. Please refer to the Current Medication list given to you today. ? ?*If you need a refill on your cardiac medications before your next appointment, please call your pharmacy* ? ? ?Lab Work: ?NONE ?If you have labs (blood work) drawn today and your tests  are completely normal, you will receive your results only by: ?MyChart Message (if you have MyChart) OR ?A paper copy in the mail ?If you have any lab test that is abnormal or we need to change your treatment, we will cal

## 2021-07-16 ENCOUNTER — Encounter: Payer: Self-pay | Admitting: Cardiovascular Disease

## 2021-07-16 ENCOUNTER — Ambulatory Visit (INDEPENDENT_AMBULATORY_CARE_PROVIDER_SITE_OTHER): Payer: 59 | Admitting: Cardiovascular Disease

## 2021-07-16 VITALS — BP 130/78 | HR 73 | Ht 75.0 in | Wt 245.4 lb

## 2021-07-16 DIAGNOSIS — E782 Mixed hyperlipidemia: Secondary | ICD-10-CM | POA: Diagnosis not present

## 2021-07-16 DIAGNOSIS — I10 Essential (primary) hypertension: Secondary | ICD-10-CM

## 2021-07-16 MED ORDER — POTASSIUM CHLORIDE ER 10 MEQ PO TBCR: EXTENDED_RELEASE_TABLET | ORAL | 3 refills | Status: DC

## 2021-07-16 MED ORDER — HYDROCHLOROTHIAZIDE 25 MG PO TABS: ORAL_TABLET | ORAL | 3 refills | Status: DC

## 2021-07-16 MED ORDER — NEBIVOLOL HCL 20 MG PO TABS: 20.0000 mg | ORAL_TABLET | Freq: Every day | ORAL | 3 refills | Status: DC

## 2021-07-16 MED ORDER — LOSARTAN POTASSIUM 100 MG PO TABS: 100.0000 mg | ORAL_TABLET | Freq: Every day | ORAL | 3 refills | Status: DC

## 2021-07-16 MED ORDER — SIMVASTATIN 40 MG PO TABS: 40.0000 mg | ORAL_TABLET | Freq: Every day | ORAL | 3 refills | Status: DC

## 2021-07-16 NOTE — Patient Instructions (Signed)
Medication Instructions:  ?Your physician recommends that you continue on your current medications as directed. Please refer to the Current Medication list given to you today. ? ?*If you need a refill on your cardiac medications before your next appointment, please call your pharmacy* ? ? ?Lab Work: ?NONE ?If you have labs (blood work) drawn today and your tests are completely normal, you will receive your results only by: ?MyChart Message (if you have MyChart) OR ?A paper copy in the mail ?If you have any lab test that is abnormal or we need to change your treatment, we will call you to review the results. ? ? ?Testing/Procedures: ?NONE ? ? ?Follow-Up: ?At [facility], you and your health needs are our priority.  As part of our continuing mission to provide you with exceptional heart care, we have created designated Provider Care Teams.  These Care Teams include your primary Cardiologist (physician) and Advanced Practice Providers (APPs -  Physician Assistants and Nurse Practitioners) who all work together to provide you with the care you need, when you need it. ? ?Your next appointment:   ?1 year(s) ? ?The format for your next appointment:   ?In Person ? ?Provider:   ?Mertie Moores, MD  or Robbie Lis, PA-C, Christen Bame, NP, or Richardson Dopp, PA-C      ? ? ?Other Instructions ? ?  ? ?Important Information About Sugar ? ? ? ? ?  ?

## 2021-07-30 ENCOUNTER — Encounter: Payer: Self-pay | Admitting: Internal Medicine

## 2021-09-20 ENCOUNTER — Other Ambulatory Visit: Payer: Self-pay

## 2021-09-20 ENCOUNTER — Ambulatory Visit (AMBULATORY_SURGERY_CENTER): Payer: Self-pay

## 2021-09-20 VITALS — Ht 75.0 in | Wt 248.0 lb

## 2021-09-20 DIAGNOSIS — Z85038 Personal history of other malignant neoplasm of large intestine: Secondary | ICD-10-CM

## 2021-09-20 MED ORDER — NA SULFATE-K SULFATE-MG SULF 17.5-3.13-1.6 GM/177ML PO SOLN: 1.0000 | Freq: Once | ORAL | 0 refills | Status: AC

## 2021-09-20 NOTE — Progress Notes (Signed)
Denies allergies to eggs or soy products. Denies complication of anesthesia or sedation. Denies use of weight loss medication. Denies use of O2.   Emmi instructions given for colonoscopy.  

## 2021-10-08 ENCOUNTER — Encounter: Payer: Self-pay | Admitting: Internal Medicine

## 2021-10-11 ENCOUNTER — Ambulatory Visit (AMBULATORY_SURGERY_CENTER): Payer: 59 | Admitting: Internal Medicine

## 2021-10-11 ENCOUNTER — Encounter: Payer: Self-pay | Admitting: Internal Medicine

## 2021-10-11 VITALS — BP 124/76 | HR 68 | Temp 97.7°F | Resp 17 | Ht 75.0 in | Wt 248.0 lb

## 2021-10-11 DIAGNOSIS — Z85038 Personal history of other malignant neoplasm of large intestine: Secondary | ICD-10-CM | POA: Diagnosis not present

## 2021-10-11 DIAGNOSIS — Z86010 Personal history of colonic polyps: Secondary | ICD-10-CM | POA: Diagnosis not present

## 2021-10-11 DIAGNOSIS — Z09 Encounter for follow-up examination after completed treatment for conditions other than malignant neoplasm: Secondary | ICD-10-CM

## 2021-10-11 DIAGNOSIS — D123 Benign neoplasm of transverse colon: Secondary | ICD-10-CM

## 2021-10-11 DIAGNOSIS — Z08 Encounter for follow-up examination after completed treatment for malignant neoplasm: Secondary | ICD-10-CM

## 2021-10-11 DIAGNOSIS — D128 Benign neoplasm of rectum: Secondary | ICD-10-CM

## 2021-10-11 MED ORDER — SODIUM CHLORIDE 0.9 % IV SOLN: 500.0000 mL | Freq: Once | INTRAVENOUS | Status: DC

## 2021-10-11 NOTE — Patient Instructions (Signed)
Please read handouts provided. Continue present medications. Await pathology results.   YOU HAD AN ENDOSCOPIC PROCEDURE TODAY AT THE [facility]:   Refer to the procedure report that was given to you for any specific questions about what was found during the examination.  If the procedure report does not answer your questions, please call your gastroenterologist to clarify.  If you requested that your care partner not be given the details of your procedure findings, then the procedure report has been included in a sealed envelope for you to review at your convenience later.  YOU SHOULD EXPECT: Some feelings of bloating in the abdomen. Passage of more gas than usual.  Walking can help get rid of the air that was put into your GI tract during the procedure and reduce the bloating. If you had a lower endoscopy (such as a colonoscopy or flexible sigmoidoscopy) you may notice spotting of blood in your stool or on the toilet paper. If you underwent a bowel prep for your procedure, you may not have a normal bowel movement for a few days.  Please Note:  You might notice some irritation and congestion in your nose or some drainage.  This is from the oxygen used during your procedure.  There is no need for concern and it should clear up in a day or so.  SYMPTOMS TO REPORT IMMEDIATELY:  Following lower endoscopy (colonoscopy or flexible sigmoidoscopy):  Excessive amounts of blood in the stool  Significant tenderness or worsening of abdominal pains  Swelling of the abdomen that is new, acute  Fever of 100F or higher  For urgent or emergent issues, a gastroenterologist can be reached at any hour by calling [phone]. Do not use MyChart messaging for urgent concerns.    DIET:  We do recommend a small meal at first, but then you may proceed to your regular diet.  Drink plenty of fluids but you should avoid alcoholic beverages for 24 hours.  ACTIVITY:  You should plan to take it easy for  the rest of today and you should NOT DRIVE or use heavy machinery until tomorrow (because of the sedation medicines used during the test).    FOLLOW UP: Our staff will call the number listed on your records the next business day following your procedure.  We will call around 7:15- 8:00 am to check on you and address any questions or concerns that you may have regarding the information given to you following your procedure. If we do not reach you, we will leave a message.  If you develop any symptoms (ie: fever, flu-like symptoms, shortness of breath, cough etc.) before then, please call [phone].  If you test positive for Covid 19 in the 2 weeks post procedure, please call and report this information to us.    If any biopsies were taken you will be contacted by phone or by letter within the next 1-3 weeks.  Please call us at [phone] if you have not heard about the biopsies in 3 weeks.    SIGNATURES/CONFIDENTIALITY: You and/or your care partner have signed paperwork which will be entered into your electronic medical record.  These signatures attest to the fact that that the information above on your After Visit Summary has been reviewed and is understood.  Full responsibility of the confidentiality of this discharge information lies with you and/or your care-partner.  

## 2021-10-11 NOTE — Progress Notes (Signed)
Called to room to assist during endoscopic procedure.  Patient ID and intended procedure confirmed with present staff. Received instructions for my participation in the procedure from the performing physician.  

## 2021-10-11 NOTE — Progress Notes (Signed)
Pt's states no medical or surgical changes since previsit or office visit. 

## 2021-10-11 NOTE — Progress Notes (Signed)
PT taken to PACU. Monitors in place. VSS. Report given to RN. 

## 2021-10-11 NOTE — Progress Notes (Signed)
GASTROENTEROLOGY PROCEDURE H&P NOTE   Primary Care Physician: Donnajean Lopes, MD    Reason for Procedure:  History of colon cancer  Plan:    Surveillance colonoscopy  Patient is appropriate for endoscopic procedure(s) in the ambulatory ([place]) setting.  The nature of the procedure, as well as the risks, benefits, and alternatives were carefully and thoroughly reviewed with the patient. Ample time for discussion and questions allowed. The patient understood, was satisfied, and agreed to proceed.     HPI: Derek [name] is a 56 y.o. male who presents for surveillance colonoscopy.  Medical history as below.  Tolerated the prep.  No recent chest pain or shortness of breath.  No abdominal pain today.  Past Medical History:  Diagnosis Date   Allergy    Anxiety    Cancer ([place])    Colon cancer ([place]) 05/2017   By Dr Paulita Fujita   Depression    GERD (gastroesophageal reflux disease)    Gout    HLD (hyperlipidemia)    Hypertension    Sleep apnea    Does not use cpap    Past Surgical History:  Procedure Laterality Date   ANKLE SURGERY     right   COLONOSCOPY  05/2017   Had adenocarcinoma sigmoid polyp   LAPAROSCOPY N/A 08/05/2017   Procedure: LAPAROSCOPY DIAGNOSTIC;  Surgeon: Coralie Keens, MD;  Location: [place];  Service: General;  Laterality: N/A;   PROCTOSCOPY N/A 08/05/2017   Procedure: RIGID PROCTOSCOPY;  Surgeon: Coralie Keens, MD;  Location: [place];  Service: General;  Laterality: N/A;    Prior to Admission medications   Medication Sig Start Date End Date Taking? Authorizing Provider  allopurinol (ZYLOPRIM) 100 MG tablet Take 100 mg by mouth every evening.   Yes [provider]  ALPRAZolam (XANAX) 0.25 MG tablet Take 0.25 mg by mouth 3 (three) times daily as needed. 02/15/20  Yes [provider]  aspirin 81 MG tablet Take 81 mg by mouth every evening.   Yes [provider]  citalopram (CELEXA) 40 MG tablet Take 40 mg by mouth every  evening.   Yes [provider]  hydrochlorothiazide (HYDRODIURIL) 25 MG tablet Take 1 tablet by mouth daily 07/16/21  Yes Nahser, Wonda Cheng, MD  hydrOXYzine (VISTARIL) 25 MG capsule Take 25 mg by mouth at bedtime as needed. 05/03/21  Yes [provider]  losartan (COZAAR) 100 MG tablet Take 1 tablet (100 mg total) by mouth daily. 07/16/21  Yes Nahser, Wonda Cheng, MD  Multiple Vitamins-Minerals (MULTIVITAMINS THER. W/MINERALS) TABS Take 1 tablet by mouth every evening.   Yes [provider]  Nebivolol HCl 20 MG TABS Take 1 tablet (20 mg total) by mouth daily. 07/16/21  Yes Nahser, Wonda Cheng, MD  omeprazole (PRILOSEC) 20 MG capsule Take 20 mg by mouth every evening.   Yes [provider]  potassium chloride (KLOR-CON) 10 MEQ tablet Take 1 tablet by mouth daily 07/16/21  Yes Nahser, Wonda Cheng, MD  simvastatin (ZOCOR) 40 MG tablet Take 1 tablet (40 mg total) by mouth at bedtime. 07/16/21  Yes Nahser, Wonda Cheng, MD  testosterone cypionate (DEPOTESTOSTERONE CYPIONATE) 200 MG/ML injection Inject 300 mg into the muscle every 21 ( twenty-one) days.    [provider]    Current Outpatient Medications  Medication Sig Dispense Refill   allopurinol (ZYLOPRIM) 100 MG tablet Take 100 mg by mouth every evening.     ALPRAZolam (XANAX) 0.25 MG tablet Take 0.25 mg by mouth 3 (three) times  daily as needed.     aspirin 81 MG tablet Take 81 mg by mouth every evening.     citalopram (CELEXA) 40 MG tablet Take 40 mg by mouth every evening.     hydrochlorothiazide (HYDRODIURIL) 25 MG tablet Take 1 tablet by mouth daily 90 tablet 3   hydrOXYzine (VISTARIL) 25 MG capsule Take 25 mg by mouth at bedtime as needed.     losartan (COZAAR) 100 MG tablet Take 1 tablet (100 mg total) by mouth daily. 90 tablet 3   Multiple Vitamins-Minerals (MULTIVITAMINS THER. W/MINERALS) TABS Take 1 tablet by mouth every evening.     Nebivolol HCl 20 MG TABS Take 1 tablet (20 mg total) by mouth daily. 90 tablet  3   omeprazole (PRILOSEC) 20 MG capsule Take 20 mg by mouth every evening.     potassium chloride (KLOR-CON) 10 MEQ tablet Take 1 tablet by mouth daily 90 tablet 3   simvastatin (ZOCOR) 40 MG tablet Take 1 tablet (40 mg total) by mouth at bedtime. 90 tablet 3   testosterone cypionate (DEPOTESTOSTERONE CYPIONATE) 200 MG/ML injection Inject 300 mg into the muscle every 21 ( twenty-one) days.     Current Facility-Administered Medications  Medication Dose Route Frequency Provider Last Rate Last Admin   0.9 %  sodium chloride infusion  500 mL Intravenous Once Adelis Docter, Lajuan Lines, MD        Allergies as of 10/11/2021 - Review Complete 10/11/2021  Allergen Reaction Noted   Cardizem [diltiazem hcl] Other (See Comments) 11/05/2017   Toprol xl [metoprolol tartrate] Other (See Comments) 11/05/2017    Family History  Problem Relation Age of Onset   Coronary artery disease Other    Hypertension Other    Lung cancer Mother    Heart disease Father    Colon polyps Sister    Colon cancer Neg Hx    Esophageal cancer Neg Hx    Stomach cancer Neg Hx    Rectal cancer Neg Hx     Social History   Socioeconomic History   Marital status: Married    Spouse name: Not on file   Number of children: Not on file   Years of education: Not on file   Highest education level: Not on file  Occupational History   Not on file  Tobacco Use   Smoking status: Never   Smokeless tobacco: Never  Vaping Use   Vaping Use: Never used  Substance and Sexual Activity   Alcohol use: Yes    Alcohol/week: 14.0 standard drinks of alcohol    Types: 14 Glasses of wine per week    Comment: about 2 glasses of wine daily   Drug use: Not Currently   Sexual activity: Not on file  Other Topics Concern   Not on file  Social History Narrative   Not on file   Social Determinants of Health   Financial Resource Strain: Not on file  Food Insecurity: Not on file  Transportation Needs: Not on file  Physical Activity: Not on file   Stress: Not on file  Social Connections: Not on file  Intimate Partner Violence: Not on file    Physical Exam: Vital signs in last 24 hours: '@BP'$  130/75   Pulse 70   Temp 97.7 F (36.5 C)   Ht '6\' 3"'$  (1.905 m)   Wt 248 lb (112.5 kg)   SpO2 97%   BMI 31.00 kg/m  GEN: NAD EYE: Sclerae anicteric ENT: MMM CV: Non-tachycardic Pulm: CTA b/l GI: Soft, NT/ND  NEURO:  Alert & Oriented x 3   Zenovia Jarred, MD [facility]  10/11/2021 8:36 AM

## 2021-10-11 NOTE — Op Note (Signed)
[facility] Patient Name: Derek [name] Procedure Date: 10/11/2021 8:39 AM MRN: [id number] Endoscopist: Jerene Bears , MD Age: 56 Referring MD:  Date of Birth: 05-29-1965 Gender: Male Account #: [id number] Procedure:                Colonoscopy Indications:              High risk colon cancer surveillance: Personal                            history of colon cancer (arising in 8 mm polyp                            removed by snare in 2019; flex sig 2019 without                            residual tumor), Last colonoscopy: May 2020 (1                            adenoma removed) Medicines:                Monitored Anesthesia Care Procedure:                Pre-Anesthesia Assessment:                           - Prior to the procedure, a History and Physical                            was performed, and patient medications and                            allergies were reviewed. The patient's tolerance of                            previous anesthesia was also reviewed. The risks                            and benefits of the procedure and the sedation                            options and risks were discussed with the patient.                            All questions were answered, and informed consent                            was obtained. Prior Anticoagulants: The patient has                            taken no previous anticoagulant or antiplatelet                            agents. ASA Grade Assessment: II - A patient with  mild systemic disease. After reviewing the risks                            and benefits, the patient was deemed in                            satisfactory condition to undergo the procedure.                           After obtaining informed consent, the colonoscope                            was passed under direct vision. Throughout the                            procedure, the patient's blood pressure, pulse, and                             oxygen saturations were monitored continuously. The                            Olympus CF-HQ190L (Serial# [id number]) Colonoscope was                            introduced through the anus and advanced to the                            cecum, identified by appendiceal orifice and                            ileocecal valve. The colonoscopy was performed                            without difficulty. The patient tolerated the                            procedure well. The quality of the bowel                            preparation was good. The ileocecal valve,                            appendiceal orifice, and rectum were photographed. Scope In: 8:44:17 AM Scope Out: 9:03:09 AM Scope Withdrawal Time: 0 hours 15 minutes 52 seconds  Total Procedure Duration: 0 hours 18 minutes 52 seconds  Findings:                 The digital rectal exam was normal.                           Three sessile polyps were found in the transverse                            colon. The polyps were 3 to 6 mm in size. These  polyps were removed with a cold snare. Resection                            and retrieval were complete.                           A 5 mm polyp was found in the mid rectum. The polyp                            was sessile. The polyp was removed with a cold                            snare. Resection and retrieval were complete.                           A few small-mouthed diverticula were found in the                            sigmoid colon.                           A tattoo was seen in the distal sigmoid colon. A                            post-polypectomy scar was found at the tattoo site.                            There was no evidence of residual polyp tissue.                           Internal hemorrhoids were found during                            retroflexion. The hemorrhoids were small. Complications:            No immediate  complications. Estimated Blood Loss:     Estimated blood loss was minimal. Impression:               - Three 3 to 6 mm polyps in the transverse colon,                            removed with a cold snare. Resected and retrieved.                           - One 5 mm polyp in the mid rectum, removed with a                            cold snare. Resected and retrieved.                           - Diverticulosis in the sigmoid colon.                           - A tattoo was seen  in the distal sigmoid colon. A                            post-polypectomy scar was found at the tattoo site.                            There was no evidence of residual polyp tissue.                           - Small internal hemorrhoids. Recommendation:           - Patient has a contact number available for                            emergencies. The signs and symptoms of potential                            delayed complications were discussed with the                            patient. Return to normal activities tomorrow.                            Written discharge instructions were provided to the                            patient.                           - Resume previous diet.                           - Continue present medications.                           - Await pathology results.                           - Repeat colonoscopy is recommended for                            surveillance. The colonoscopy date will be                            determined after pathology results from today's                            exam become available for review. Jerene Bears, MD 10/11/2021 9:07:39 AM This report has been signed electronically.

## 2021-10-12 ENCOUNTER — Telehealth: Payer: Self-pay

## 2021-10-12 NOTE — Telephone Encounter (Signed)
ATTEMPTED F/U CALL. NO VM SETUP.

## 2021-10-15 ENCOUNTER — Encounter: Payer: Self-pay | Admitting: Internal Medicine

## 2022-02-19 ENCOUNTER — Other Ambulatory Visit: Payer: Self-pay

## 2022-02-19 MED ORDER — LOSARTAN POTASSIUM 100 MG PO TABS: 100.0000 mg | ORAL_TABLET | Freq: Every day | ORAL | 1 refills | Status: DC

## 2022-06-20 ENCOUNTER — Other Ambulatory Visit: Payer: Self-pay | Admitting: Cardiovascular Disease

## 2022-07-10 ENCOUNTER — Telehealth: Payer: Self-pay | Admitting: Cardiovascular Disease

## 2022-07-10 MED ORDER — HYDROCHLOROTHIAZIDE 25 MG PO TABS: ORAL_TABLET | ORAL | 0 refills | Status: DC

## 2022-07-10 MED ORDER — POTASSIUM CHLORIDE ER 10 MEQ PO TBCR: EXTENDED_RELEASE_TABLET | ORAL | 0 refills | Status: DC

## 2022-07-10 MED ORDER — NEBIVOLOL HCL 20 MG PO TABS: 20.0000 mg | ORAL_TABLET | Freq: Every day | ORAL | 0 refills | Status: DC

## 2022-07-10 MED ORDER — LOSARTAN POTASSIUM 100 MG PO TABS: 100.0000 mg | ORAL_TABLET | Freq: Every day | ORAL | 0 refills | Status: DC

## 2022-07-10 NOTE — Telephone Encounter (Signed)
Patient has appt on 7/16

## 2022-07-10 NOTE — Telephone Encounter (Signed)
*  STAT* If patient is at the pharmacy, call can be transferred to refill team.   1. Which medications need to be refilled? (please list name of each medication and dose if known) hydrochlorothiazide (HYDRODIURIL) 25 MG tablet    losartan (COZAAR) 100 MG tablet   Nebivolol HCl 20 MG TABS    potassium chloride (KLOR-CON) 10 MEQ tablet    2. Which pharmacy/location (including street and city if local pharmacy) is medication to be sent to? [facility], [place] - [address]   3. Do they need a 30 day or 90 day supply? 90

## 2022-07-10 NOTE — Telephone Encounter (Signed)
Pt's medications were sent to pt's pharmacy as requested. Confirmation received.  

## 2022-09-18 ENCOUNTER — Other Ambulatory Visit: Payer: Self-pay | Admitting: Cardiovascular Disease

## 2022-10-06 ENCOUNTER — Encounter: Payer: Self-pay | Admitting: Cardiovascular Disease

## 2022-10-06 NOTE — Progress Notes (Signed)
Cardiology Office Note:    Date:  10/08/2022   ID:  [name], DOB 07/02/1965, MRN [id number]  PCP:  Garlan Fillers, MD  Cardiologist:  Montel Vanderhoof  Referring MD: Garlan Fillers, MD   Problem list 1.  Hypertension 2.  Hyperlipidemia 3.  Colon cancer  Chief Complaint  Patient presents with   Hypertension     Sept. 10, 2019   Derek [name] is a 57 y.o. male with a hx of hypertension and hyperlipidemia.  I last saw Derek [name] in September, 2013.  Since I last seen him he was diagnosed with well-differentiated adenocarcinoma of the colon ( in March, 2019)   Derek [name] is seen after 5 years absence.   Is now in sales,  [employer] to [employer].  His son is at [facility] . Daughter is at [employer].    His first colonoscopy  Shows well differentiated colon cancer  Is going to see Dr. Cliffton Asters at [facility] . Also sees [employer] .   Has some anxiety - especially with this diagnosis His father also had some additional stents placed this past year.   Still getting regular exercise.   Walks regularly  3-4 days a week.   No CP or dyspnea with that .    Bp has been mildly elevated. Is on Lisinopril - HCTZ 20-12.5 mg a day  ( med list is not correct today ) Bystolic 20 mg a day .    Jan. 27, 2022  Swan is seen today for follow up of his HTN and HLD. Has a hx of colon cancer  Walks every other day .  Trail walks No CP with walking .  Colon cancer has been stable   July 16, 2021 Derek [name]  is seen today for follow up of his HTN, HLD  Is exercising , walks his cocker spaniel regularly   Labs from Dr. Jarold Motto reveals Total cholesterol is 153 HDL is 46 LDL is 55 Triglycerides are 262. We discussed   October 08, 2022   Derek [name] is seen for follow up of his HTN, HLD  Doing  well  Exercising ,  walks his dog ( cocker spaniel)  Wt is 247 lbs    Labs from [facility] office   June 27, 2022:  Total cholesterol is 255 HDL is 68 LDL is 55 Triglyceride level is  134  Past Medical History:  Diagnosis Date   Allergy    Anxiety    Cancer (HCC)    Colon cancer (HCC) 05/2017   By Dr Dulce Sellar   Depression    GERD (gastroesophageal reflux disease)    Gout    HLD (hyperlipidemia)    Hypertension    Sleep apnea    Does not use cpap    Past Surgical History:  Procedure Laterality Date   ANKLE SURGERY     right   COLONOSCOPY  05/2017   Had adenocarcinoma sigmoid polyp   LAPAROSCOPY N/A 08/05/2017   Procedure: LAPAROSCOPY DIAGNOSTIC;  Surgeon: Abigail Miyamoto, MD;  Location: [facility] OR;  Service: General;  Laterality: N/A;   PROCTOSCOPY N/A 08/05/2017   Procedure: RIGID PROCTOSCOPY;  Surgeon: Abigail Miyamoto, MD;  Location: [facility] OR;  Service: General;  Laterality: N/A;    Current Medications: Current Meds  Medication Sig   allopurinol (ZYLOPRIM) 100 MG tablet Take 100 mg by mouth every evening.   ALPRAZolam (XANAX) 0.25 MG tablet Take 0.25 mg by mouth 3 (three) times daily as needed.   aspirin  81 MG tablet Take 81 mg by mouth every evening.   citalopram (CELEXA) 40 MG tablet Take 40 mg by mouth every evening.   hydrochlorothiazide (HYDRODIURIL) 25 MG tablet Take 1 tablet by mouth daily. Please keep upcoming appointment in July 2024 before anymore refills.   hydrOXYzine (VISTARIL) 25 MG capsule Take 25 mg by mouth at bedtime as needed.   losartan (COZAAR) 100 MG tablet Take 1 tablet (100 mg total) by mouth daily.   Multiple Vitamins-Minerals (MULTIVITAMINS THER. W/MINERALS) TABS Take 1 tablet by mouth every evening.   Nebivolol HCl 20 MG TABS Take 1 tablet by mouth daily. Please keep upcoming appointment in July 2024 before anymore refills.   omeprazole (PRILOSEC) 20 MG capsule Take 20 mg by mouth every evening.   potassium chloride (KLOR-CON) 10 MEQ tablet Take 1 tablet by mouth daily. Must keep upcoming appointment in July 2024 with Dr. Elease Hashimoto before anymore refills. Final attempt.   testosterone cypionate (DEPOTESTOSTERONE CYPIONATE) 200 MG/ML  injection Inject 300 mg into the muscle every 21 ( twenty-one) days.   [DISCONTINUED] methocarbamol (ROBAXIN) 500 MG tablet 1 tablet Oral every 8 hrs prn for muscle spasms for 30 days   [DISCONTINUED] simvastatin (ZOCOR) 40 MG tablet Take 1 tablet (40 mg total) by mouth at bedtime.   [DISCONTINUED] tirzepatide [facility]) 5 MG/0.5ML Pen inject 5mg  Subcutaneous weekly for 30 days     Allergies:   Cardizem [diltiazem hcl] and Toprol xl [metoprolol tartrate]   Social History   Socioeconomic History   Marital status: Married    Spouse name: Not on file   Number of children: Not on file   Years of education: Not on file   Highest education level: Not on file  Occupational History   Not on file  Tobacco Use   Smoking status: Never   Smokeless tobacco: Never  Vaping Use   Vaping status: Never Used  Substance and Sexual Activity   Alcohol use: Yes    Alcohol/week: 14.0 standard drinks of alcohol    Types: 14 Glasses of wine per week    Comment: about 2 glasses of wine daily   Drug use: Not Currently   Sexual activity: Not on file  Other Topics Concern   Not on file  Social History Narrative   Not on file   Social Determinants of Health   Financial Resource Strain: Not on file  Food Insecurity: Not on file  Transportation Needs: Not on file  Physical Activity: Not on file  Stress: Not on file  Social Connections: Not on file     Family History: The patient's family history includes Colon polyps in his sister; Coronary artery disease in an other family member; Heart disease in his father; Hypertension in an other family member; Lung cancer in his mother. There is no history of Colon cancer, Esophageal cancer, Stomach cancer, or Rectal cancer.  ROS:   Please see the history of present illness.     All other systems reviewed and are negative.  EKGs/Labs/Other Studies Reviewed:    The following studies were reviewed today:   Recent Labs: No results found for requested labs  within last 365 days.  Recent Lipid Panel No results found for: "CHOL", "TRIG", "HDL", "CHOLHDL", "VLDL", "LDLCALC", "LDLDIRECT"  Physical Exam:    Physical Exam: Blood pressure 132/82, pulse 83, height 6\' 3"  (1.905 m), weight 247 lb (112 kg), SpO2 93%.  GEN:  Well nourished, well developed in no acute distress HEENT: Normal NECK: No JVD; No carotid  bruits LYMPHATICS: No lymphadenopathy CARDIAC: RRR , no murmurs, rubs, gallops RESPIRATORY:  Clear to auscultation without rales, wheezing or rhonchi  ABDOMEN: Soft, non-tender, non-distended MUSCULOSKELETAL:  No edema; No deformity  SKIN: Warm and dry NEUROLOGIC:  Alert and oriented x 3   ECG :    EKG Interpretation Date/Time:  Tuesday October 08 2022 09:47:25 EDT Ventricular Rate:  87 PR Interval:  146 QRS Duration:  96 QT Interval:  370 QTC Calculation: 445 R Axis:   65  Text Interpretation: Normal sinus rhythm Normal ECG When compared with ECG of 31-Jul-2017 10:29, No significant change was found Confirmed by Kristeen Miss ([id number]) on 10/08/2022 10:04:04 AM      ASSESSMENT:    1. Essential hypertension   2. Mixed hyperlipidemia      PLAN:      1.  Hypertension:   BP is well controlled.    2.  Hyperlipidemia:   doing well .  Lipids look great.  Cont simva 40 , refill simva  .   Will see him in 1 year     Medication Adjustments/Labs and Tests Ordered: Current medicines are reviewed at length with the patient today.  Concerns regarding medicines are outlined above.  Orders Placed This Encounter  Procedures   EKG 12-Lead   Meds ordered this encounter  Medications   simvastatin (ZOCOR) 40 MG tablet    Sig: Take 1 tablet (40 mg total) by mouth at bedtime.    Dispense:  90 tablet    Refill:  3     Patient Instructions  Medication Instructions:  REFILLED Simvastatin *If you need a refill on your cardiac medications before your next appointment, please call your pharmacy*   Lab Work: NONE If you have  labs (blood work) drawn today and your tests are completely normal, you will receive your results only by: MyChart Message (if you have MyChart) OR A paper copy in the mail If you have any lab test that is abnormal or we need to change your treatment, we will call you to review the results.   Testing/Procedures: NONE   Follow-Up: At [facility], you and your health needs are our priority.  As part of our continuing mission to provide you with exceptional heart care, we have created designated Provider Care Teams.  These Care Teams include your primary Cardiologist (physician) and Advanced Practice Providers (APPs -  Physician Assistants and Nurse Practitioners) who all work together to provide you with the care you need, when you need it.  We recommend signing up for the patient portal called "MyChart".  Sign up information is provided on this After Visit Summary.  MyChart is used to connect with patients for Virtual Visits (Telemedicine).  Patients are able to view lab/test results, encounter notes, upcoming appointments, etc.  Non-urgent messages can be sent to your provider as well.   To learn more about what you can do with MyChart, go to [email or web address].    Your next appointment:   1 year(s)  Provider:   Kristeen Miss, MD        Signed, Kristeen Miss, MD  10/08/2022 6:24 PM    [facility]

## 2022-10-08 ENCOUNTER — Ambulatory Visit: Payer: Managed Care, Other (non HMO) | Attending: Cardiovascular Disease | Admitting: Cardiovascular Disease

## 2022-10-08 VITALS — BP 132/82 | HR 83 | Ht 75.0 in | Wt 247.0 lb

## 2022-10-08 DIAGNOSIS — E782 Mixed hyperlipidemia: Secondary | ICD-10-CM

## 2022-10-08 DIAGNOSIS — I10 Essential (primary) hypertension: Secondary | ICD-10-CM

## 2022-10-08 MED ORDER — SIMVASTATIN 40 MG PO TABS: 40.0000 mg | ORAL_TABLET | Freq: Every day | ORAL | 3 refills | Status: DC

## 2022-10-08 NOTE — Patient Instructions (Signed)
Medication Instructions:  REFILLED Simvastatin *If you need a refill on your cardiac medications before your next appointment, please call your pharmacy*   Lab Work: NONE If you have labs (blood work) drawn today and your tests are completely normal, you will receive your results only by: MyChart Message (if you have MyChart) OR A paper copy in the mail If you have any lab test that is abnormal or we need to change your treatment, we will call you to review the results.   Testing/Procedures: NONE   Follow-Up: At [facility], you and your health needs are our priority.  As part of our continuing mission to provide you with exceptional heart care, we have created designated Provider Care Teams.  These Care Teams include your primary Cardiologist (physician) and Advanced Practice Providers (APPs -  Physician Assistants and Nurse Practitioners) who all work together to provide you with the care you need, when you need it.  We recommend signing up for the patient portal called "MyChart".  Sign up information is provided on this After Visit Summary.  MyChart is used to connect with patients for Virtual Visits (Telemedicine).  Patients are able to view lab/test results, encounter notes, upcoming appointments, etc.  Non-urgent messages can be sent to your provider as well.   To learn more about what you can do with MyChart, go to [email or web address].    Your next appointment:   1 year(s)  Provider:   Kristeen Miss, MD

## 2022-11-12 ENCOUNTER — Other Ambulatory Visit: Payer: Self-pay | Admitting: Cardiovascular Disease

## 2022-12-12 ENCOUNTER — Other Ambulatory Visit: Payer: Self-pay | Admitting: Cardiovascular Disease

## 2023-09-04 ENCOUNTER — Encounter: Payer: Self-pay | Admitting: Cardiovascular Disease

## 2023-09-08 ENCOUNTER — Other Ambulatory Visit: Payer: Self-pay | Admitting: Cardiovascular Disease

## 2023-09-09 ENCOUNTER — Other Ambulatory Visit: Payer: Self-pay

## 2023-09-09 MED ORDER — SIMVASTATIN 40 MG PO TABS: 40.0000 mg | ORAL_TABLET | Freq: Every day | ORAL | 0 refills | Status: DC

## 2023-10-08 ENCOUNTER — Other Ambulatory Visit: Payer: Self-pay | Admitting: Cardiovascular Disease

## 2023-11-06 ENCOUNTER — Other Ambulatory Visit: Payer: Self-pay

## 2023-11-06 MED ORDER — SIMVASTATIN 40 MG PO TABS: 40.0000 mg | ORAL_TABLET | Freq: Every day | ORAL | 0 refills | Status: DC

## 2023-11-06 MED ORDER — LOSARTAN POTASSIUM 100 MG PO TABS: 100.0000 mg | ORAL_TABLET | Freq: Every day | ORAL | 0 refills | Status: DC

## 2023-12-01 ENCOUNTER — Ambulatory Visit (HOSPITAL_COMMUNITY): Payer: Self-pay | Admitting: Cardiology

## 2023-12-01 ENCOUNTER — Ambulatory Visit (HOSPITAL_COMMUNITY)
Admission: RE | Admit: 2023-12-01 | Discharge: 2023-12-01 | Disposition: A | Discharge status: Home / Self Care | Source: Ambulatory Visit | Attending: Cardiology | Admitting: Cardiology

## 2023-12-01 VITALS — BP 128/80 | HR 80 | Wt 248.6 lb

## 2023-12-01 DIAGNOSIS — E785 Hyperlipidemia, unspecified: Secondary | ICD-10-CM | POA: Diagnosis present

## 2023-12-01 DIAGNOSIS — Z85038 Personal history of other malignant neoplasm of large intestine: Secondary | ICD-10-CM | POA: Diagnosis not present

## 2023-12-01 DIAGNOSIS — Z79899 Other long term (current) drug therapy: Secondary | ICD-10-CM | POA: Diagnosis not present

## 2023-12-01 DIAGNOSIS — F1090 Alcohol use, unspecified, uncomplicated: Secondary | ICD-10-CM | POA: Insufficient documentation

## 2023-12-01 DIAGNOSIS — Z136 Encounter for screening for cardiovascular disorders: Secondary | ICD-10-CM | POA: Insufficient documentation

## 2023-12-01 DIAGNOSIS — I10 Essential (primary) hypertension: Secondary | ICD-10-CM

## 2023-12-01 DIAGNOSIS — E782 Mixed hyperlipidemia: Secondary | ICD-10-CM

## 2023-12-01 LAB — BASIC METABOLIC PANEL WITH GFR
Anion gap: 13 (ref 5–15)
BUN: 11 mg/dL (ref 6–20)
CO2: 23 mmol/L (ref 22–32)
Calcium: 9.2 mg/dL (ref 8.9–10.3)
Chloride: 103 mmol/L (ref 98–111)
Creatinine, Ser: 0.99 mg/dL (ref 0.61–1.24)
GFR, Estimated: >60 mL/min (ref >60)
Glucose, Bld: 170 mg/dL — ABNORMAL HIGH (ref 70–99)
Potassium: 3.5 mmol/L (ref 3.5–5.1)
Sodium: 139 mmol/L (ref 135–145)

## 2023-12-01 LAB — LIPID PANEL
Cholesterol: 163 mg/dL (ref 0–200)
HDL: 69 mg/dL (ref >40)
LDL Cholesterol: 19 mg/dL (ref 0–99)
Total CHOL/HDL Ratio: 2.4 ratio
Triglycerides: 377 mg/dL — ABNORMAL HIGH (ref <150)
VLDL: 75 mg/dL — ABNORMAL HIGH (ref 0–40)

## 2023-12-01 NOTE — Progress Notes (Addendum)
 PCP: Yolande Toribio MATSU, MD Cardiology: Dr. Rolan  Chief complaint: cardiac risk  58 y.o. with history of HTN and hyperlipidemia was self-referred for evaluation of cardiac risk. Patient saw Dr. Alveta in the past, had a remote stress test that was normal. He has been treated for HTN > 10 years, BP is currently well-controlled on 3 meds.  He has been on simvastatin  for at least 10 years as well.  He had early stage colon cancer diagnosed in 2019 and gets regular screening colonoscopies.  Good exercise tolerance, no chest pain or exertional dyspnea.  Walks dog regularly.  No palpitations or lightheadedness.  Father had significant CAD history, first noted at age 72.  He has never smoked, drinks moderate ETOH.    ECG (personally reviewed): NSR, normal  PMH: 1. HTN 2. Hyperlipidemia 3. Gout 4. Colon cancer: 2019.  5. GERD 6. Depression/anxiety  SH: Married, 2 kids, lives in [place], works in [employer].  Nonsmoker, occasional ETOH.   FH: Father with MI around age 93, grandfathers with CVAs.    ROS: All systems reviewed and negative except as per HPI.   Current Outpatient Medications  Medication Sig Dispense Refill   allopurinol (ZYLOPRIM) 100 MG tablet Take 100 mg by mouth every evening.     ALPRAZolam (XANAX) 0.25 MG tablet Take 0.25 mg by mouth 3 (three) times daily as needed.     aspirin 81 MG tablet Take 81 mg by mouth every evening.     citalopram (CELEXA) 40 MG tablet Take 40 mg by mouth every evening.     hydrochlorothiazide  (HYDRODIURIL ) 25 MG tablet Take 1 tablet by mouth daily. 90 tablet 3   hydrOXYzine (VISTARIL) 25 MG capsule Take 25 mg by mouth at bedtime as needed.     losartan  (COZAAR ) 100 MG tablet Take 1 tablet (100 mg total) by mouth daily. 15 tablet 0   Multiple Vitamins-Minerals (MULTIVITAMINS THER. W/MINERALS) TABS Take 1 tablet by mouth every evening.     Nebivolol  HCl 20 MG TABS Take 1 tablet (20 mg total) by mouth daily. 90 tablet 3   omeprazole (PRILOSEC) 20 MG  capsule Take 20 mg by mouth every evening.     potassium chloride  (KLOR-CON ) 10 MEQ tablet Take 1 tablet by mouth daily. 90 tablet 3   propranolol (INDERAL) 10 MG tablet Take 10 mg by mouth daily.     simvastatin  (ZOCOR ) 40 MG tablet Take 1 tablet (40 mg total) by mouth at bedtime. 15 tablet 0   testosterone cypionate (DEPOTESTOSTERONE CYPIONATE) 200 MG/ML injection Inject 300 mg into the muscle every 21 ( twenty-one) days.     No current facility-administered medications for this encounter.   BP 128/80   Pulse 80   Wt 112.8 kg (248 lb 9.6 oz)   SpO2 96%   BMI 31.07 kg/m  General: NAD Neck: No JVD, no thyromegaly or thyroid nodule.  Lungs: Clear to auscultation bilaterally with normal respiratory effort. CV: Nondisplaced PMI.  Heart regular S1/S2, no S3/S4, no murmur.  No peripheral edema.  No carotid bruit.  Normal pedal pulses.  Abdomen: Soft, nontender, no hepatosplenomegaly, no distention.  Skin: Intact without lesions or rashes.  Neurologic: Alert and oriented x 3.  Psych: Normal affect. Extremities: No clubbing or cyanosis.  HEENT: Normal.   Assessment/Plan: 1. HTN: BP is well-controlled on current regimen.   - Continue current meds with goal SBP < 130.  - Check BMET today with hydrochlorothiazide  and losartan  use.  2. Cardiac risk evaluation: Normal ECG,  no exertional symptoms.  Has HTN and hyperlipidemia, has FH of CAD but not premature.  We extensively discussed his cardiac risk factors.  - I will arrange for coronary calcium scoring for risk stratification.  - Continue BP meds with goal SBP < 130.  - Continue statin.  - Increase exercise level.  With BMI 31, goal to lose some weight.  3. Hyperlipidemia: He is on simvastatin  40 mg daily.  - Check lipids and lipoprotein (a).  - Will determine LDL goal based on calcium score scan.   Followup 1 year  I spent 56 minutes reviewing records, interviewing/examining patient, and managing orders.   Ezra Shuck 12/01/2023

## 2023-12-01 NOTE — Telephone Encounter (Addendum)
 Pt aware, agreeable, and verbalized understanding   ----- Message from Ezra Shuck sent at 12/01/2023  2:08 PM EDT ----- LDL is very low (this is good) but triglycerides are high.  Work on diet for now to change this, would consider addition of Vascepa for triglycerides if calcium score is elevated.  ----- Message ----- From: Rebecka, Lab In [place] Sent: 12/01/2023  11:05 AM EDT To: Ezra GORMAN Shuck, MD

## 2023-12-01 NOTE — Patient Instructions (Signed)
 Great to see you today!!!  Labs done today, your results will be available in MyChart, we will contact you for abnormal readings.   CT Coronary Calcium Score: Your physician has recommended that you have CT Coronary Calcium Score. You will be called to schedule this  - $99 out of pocket cost at the time of your test   Your physician recommends that you schedule a follow-up appointment in: 1 year (Sept 2026), **Please call our office in July to schedule this appointment    If you have any questions or concerns before your next appointment please send us  a message through [place] or call our office at [phone].    TO LEAVE A MESSAGE FOR THE NURSE SELECT OPTION 2, PLEASE LEAVE A MESSAGE INCLUDING: YOUR NAME DATE OF BIRTH CALL BACK NUMBER REASON FOR CALL**this is important as we prioritize the call backs  YOU WILL RECEIVE A CALL BACK THE SAME DAY AS LONG AS YOU CALL BEFORE 4:00 PM

## 2023-12-02 LAB — LIPOPROTEIN A (LPA): Lipoprotein (a): <8.4 nmol/L (ref <75.0)

## 2023-12-08 ENCOUNTER — Other Ambulatory Visit (HOSPITAL_COMMUNITY): Payer: Self-pay | Admitting: Cardiology

## 2023-12-08 MED ORDER — SIMVASTATIN 40 MG PO TABS: 40.0000 mg | ORAL_TABLET | Freq: Every day | ORAL | 3 refills | Status: AC

## 2023-12-08 MED ORDER — LOSARTAN POTASSIUM 100 MG PO TABS: 100.0000 mg | ORAL_TABLET | Freq: Every day | ORAL | 3 refills | Status: AC

## 2023-12-12 ENCOUNTER — Ambulatory Visit (HOSPITAL_COMMUNITY)
Admission: RE | Admit: 2023-12-12 | Discharge: 2023-12-12 | Disposition: A | Discharge status: Home / Self Care | Payer: Self-pay | Source: Ambulatory Visit | Attending: Cardiology | Admitting: Cardiology

## 2023-12-12 DIAGNOSIS — I10 Essential (primary) hypertension: Secondary | ICD-10-CM | POA: Insufficient documentation

## 2023-12-12 DIAGNOSIS — E782 Mixed hyperlipidemia: Secondary | ICD-10-CM | POA: Insufficient documentation

## 2023-12-22 ENCOUNTER — Telehealth (HOSPITAL_COMMUNITY): Payer: Self-pay

## 2023-12-22 NOTE — Telephone Encounter (Signed)
 Aware of calcium score results
